# Patient Record
Sex: Female | Born: 1995
Health system: Southern US, Community
[De-identification: ages and names within clinical notes are randomized; demographics above are authoritative.]

## PROBLEM LIST (undated history)

## (undated) DIAGNOSIS — D821 Di George's syndrome: Secondary | ICD-10-CM

## (undated) DIAGNOSIS — R002 Palpitations: Secondary | ICD-10-CM

## (undated) DIAGNOSIS — I341 Nonrheumatic mitral (valve) prolapse: Secondary | ICD-10-CM

## (undated) DIAGNOSIS — R9431 Abnormal electrocardiogram [ECG] [EKG]: Secondary | ICD-10-CM

## (undated) DIAGNOSIS — F319 Bipolar disorder, unspecified: Secondary | ICD-10-CM

## (undated) DIAGNOSIS — F419 Anxiety disorder, unspecified: Secondary | ICD-10-CM

## (undated) DIAGNOSIS — R6250 Unspecified lack of expected normal physiological development in childhood: Secondary | ICD-10-CM

## (undated) DIAGNOSIS — F4310 Post-traumatic stress disorder, unspecified: Secondary | ICD-10-CM

## (undated) HISTORY — DX: Nonrheumatic mitral (valve) prolapse: I34.1

## (undated) HISTORY — DX: Post-traumatic stress disorder, unspecified: F43.10

## (undated) HISTORY — DX: Palpitations: R00.2

## (undated) HISTORY — DX: Anxiety disorder, unspecified: F41.9

## (undated) HISTORY — DX: Abnormal electrocardiogram (ECG) (EKG): R94.31

## (undated) HISTORY — DX: Unspecified lack of expected normal physiological development in childhood: R62.50

## (undated) HISTORY — DX: Bipolar disorder, unspecified: F31.9

## (undated) HISTORY — DX: Di George's syndrome: D82.1

---
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
## 2015-01-13 ENCOUNTER — Encounter: Payer: Self-pay | Admitting: Obstetrics and Gynecology

## 2015-01-15 IMAGING — US THYROID ULTRASOUND
1 series · 14 of 22 positions shown · non-contrast
Comparison: None.

CLINICAL DATA: Mass of Right Neck

EXAM:
ULTRASOUND OF HEAD/NECK SOFT TISSUES
TECHNIQUE: Ultrasound examination of the head and neck soft tissues was
performed in the area of clinical concern.

[Series 1: thyroid ultrasound · 0.08mm/px · 14 of 22 slices shown]
[im 1/22]
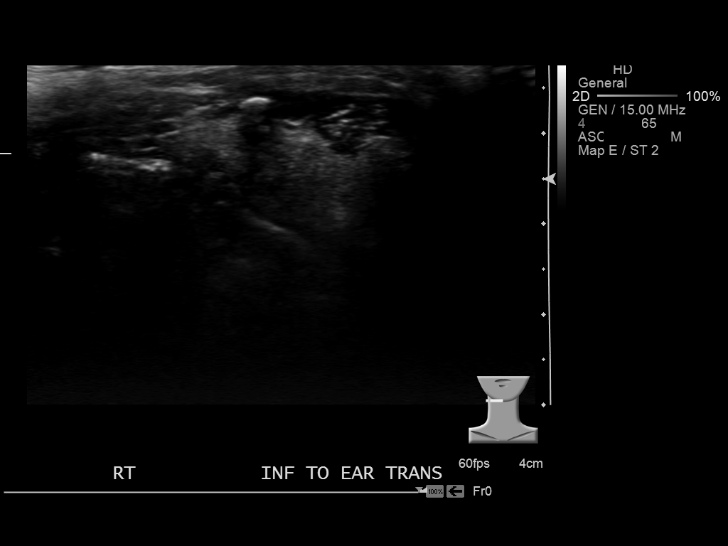
[im 3/22]
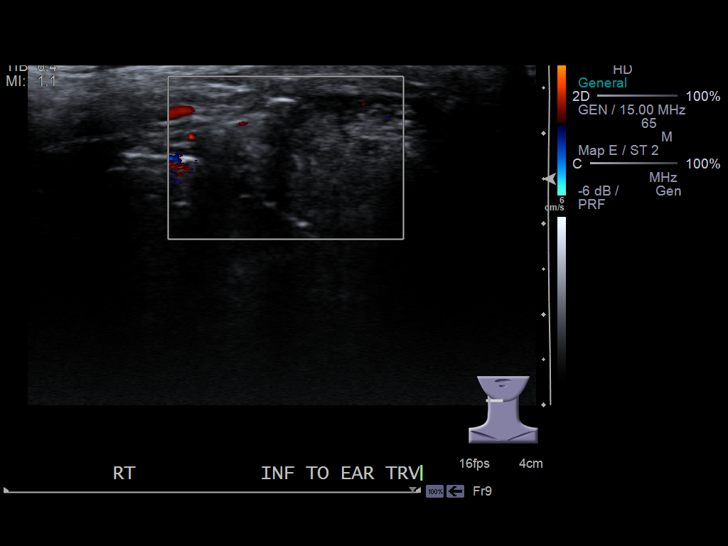
[im 4/22]
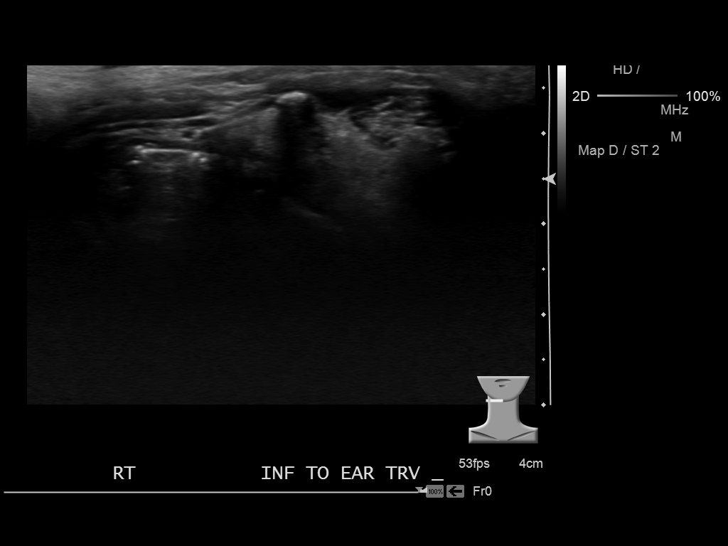
[im 6/22]
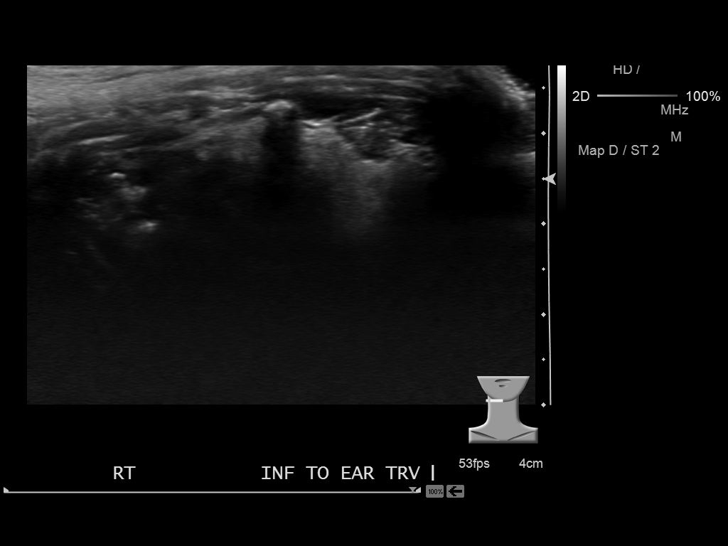
[im 8/22]
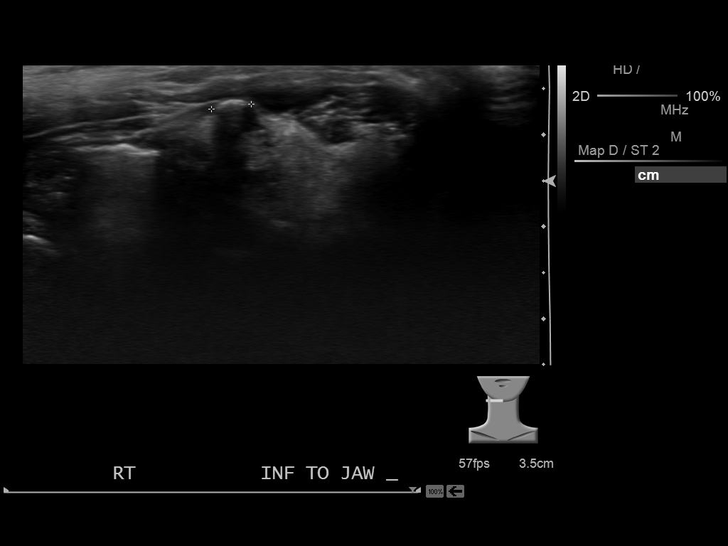
[im 9/22]
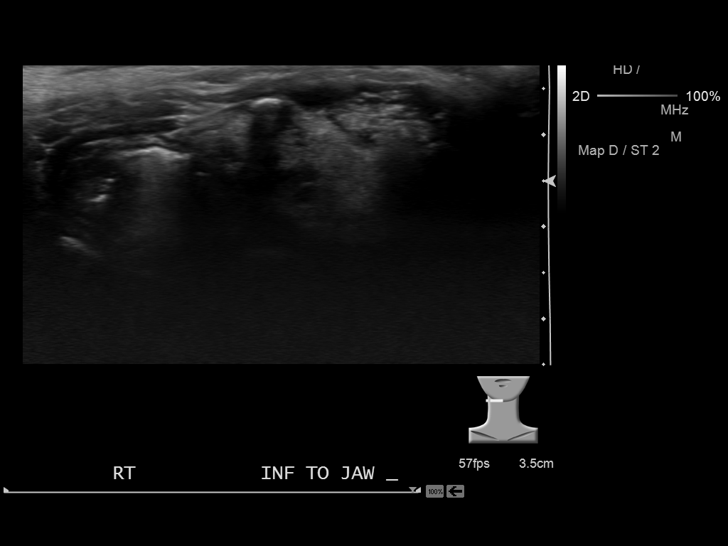
[im 11/22]
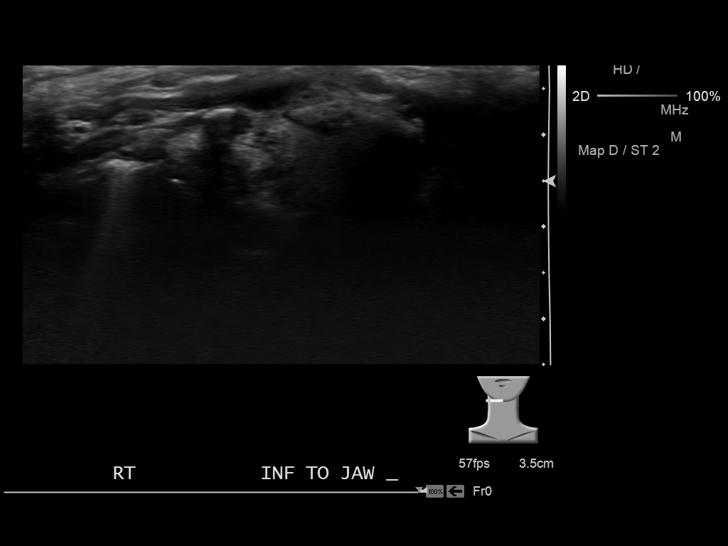
[im 12/22]
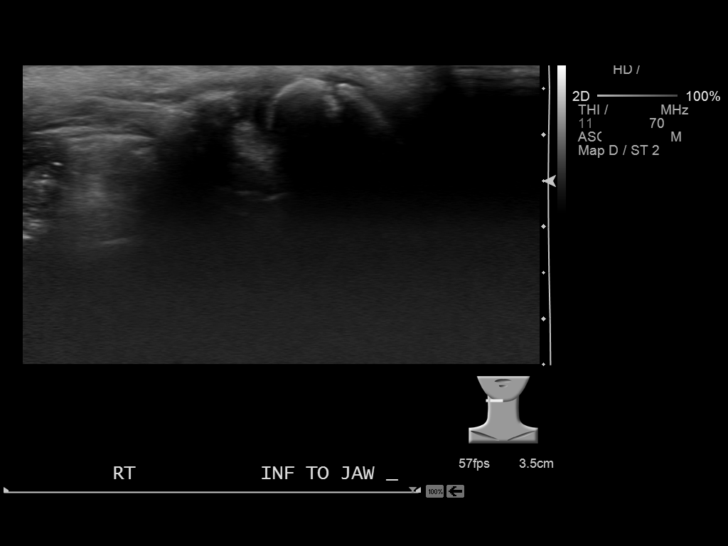
[im 14/22]
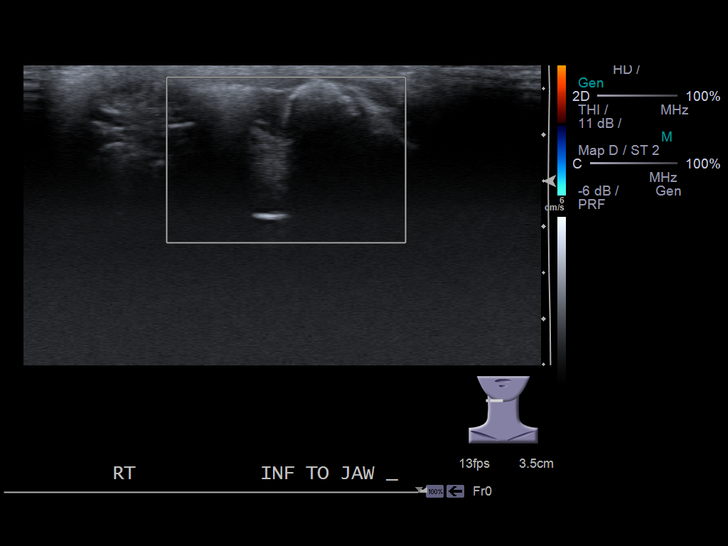
[im 15/22]
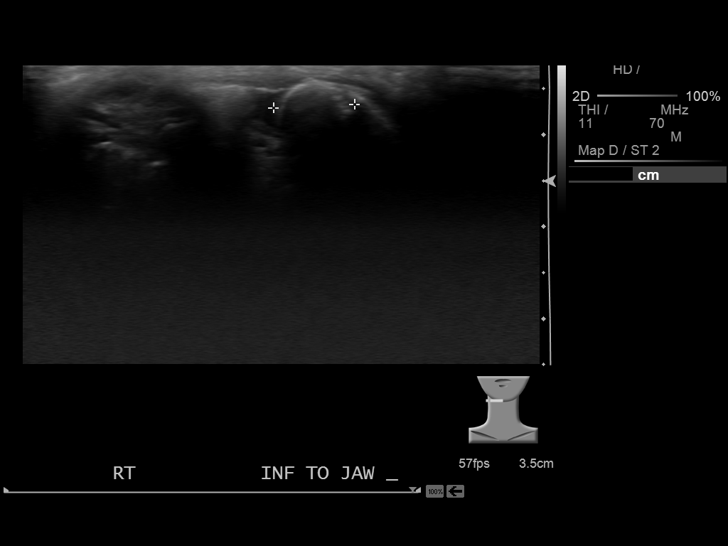
[im 17/22]
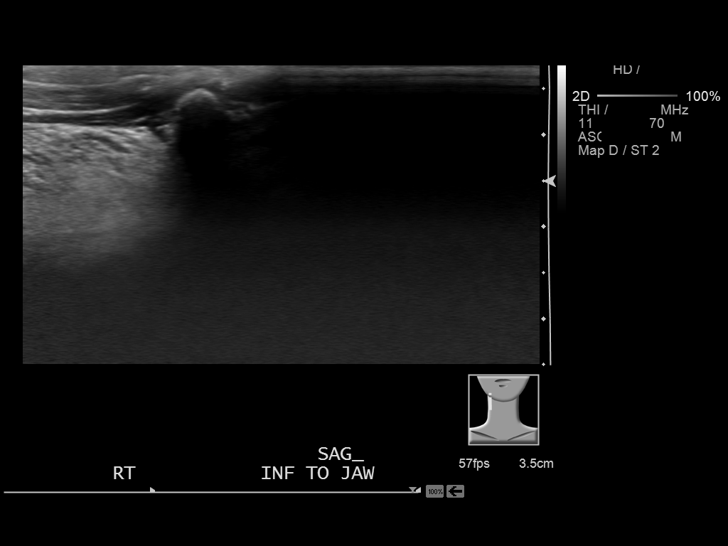
[im 19/22]
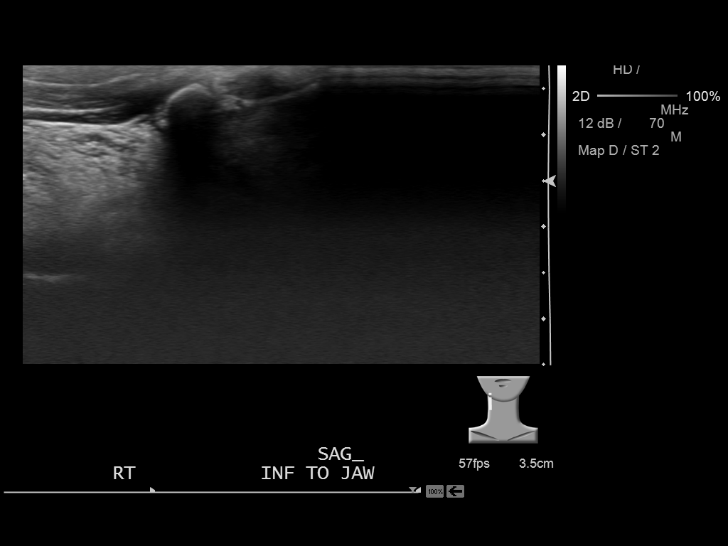
[im 20/22]
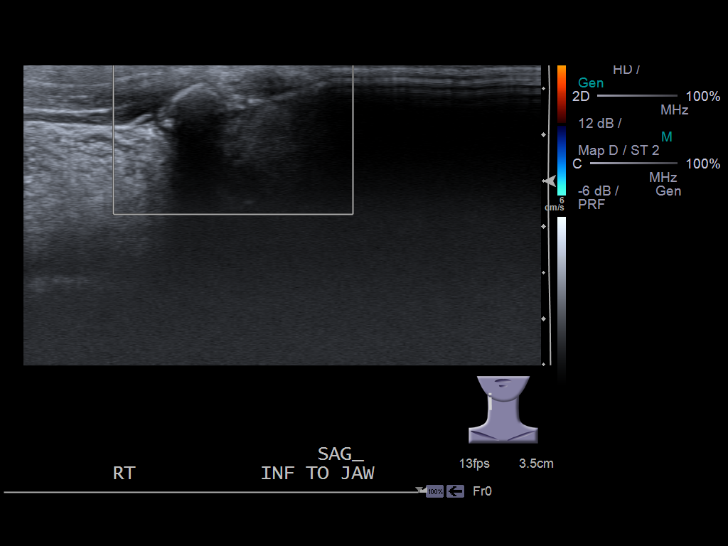
[im 22/22]
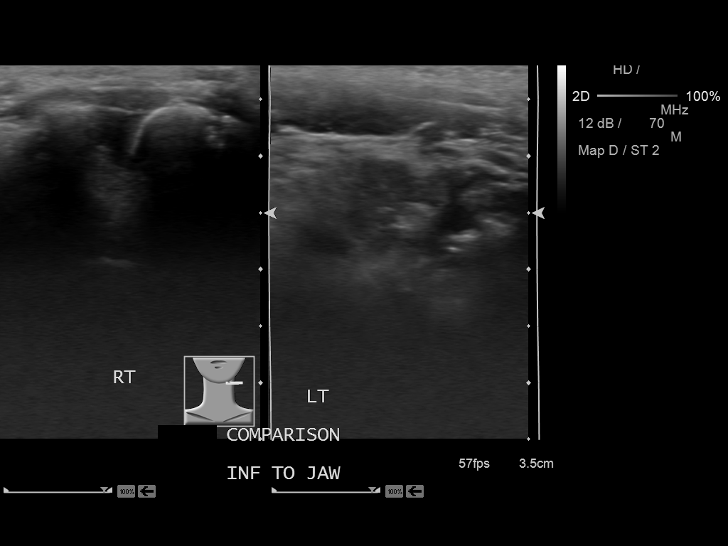

[14 of 22 positions shown; findings below may reference images not displayed]

FINDINGS: There is a cluster of shadowing calcifications in the right
infra-auricular/ submandibular deep subcutaneous tissues measured up
to 9 mm.
IMPRESSION: Lesion with clustered coarse calcifications in region of palpable
abnormality. CT neck with contrast suggested for further
characterization.

## 2015-02-06 ENCOUNTER — Encounter: Payer: Self-pay | Admitting: Obstetrics and Gynecology

## 2015-02-25 ENCOUNTER — Encounter: Payer: Self-pay | Admitting: Obstetrics and Gynecology

## 2015-02-25 ENCOUNTER — Ambulatory Visit (INDEPENDENT_AMBULATORY_CARE_PROVIDER_SITE_OTHER): Payer: BLUE CROSS/BLUE SHIELD | Admitting: Obstetrics and Gynecology

## 2015-02-25 VITALS — BP 84/53 | HR 88 | Ht 67.0 in | Wt 103.1 lb

## 2015-02-25 DIAGNOSIS — A499 Bacterial infection, unspecified: Secondary | ICD-10-CM | POA: Diagnosis not present

## 2015-02-25 DIAGNOSIS — N898 Other specified noninflammatory disorders of vagina: Secondary | ICD-10-CM

## 2015-02-25 DIAGNOSIS — L280 Lichen simplex chronicus: Secondary | ICD-10-CM

## 2015-02-25 DIAGNOSIS — B9689 Other specified bacterial agents as the cause of diseases classified elsewhere: Secondary | ICD-10-CM

## 2015-02-25 DIAGNOSIS — N760 Acute vaginitis: Secondary | ICD-10-CM

## 2015-02-25 DIAGNOSIS — N904 Leukoplakia of vulva: Secondary | ICD-10-CM | POA: Insufficient documentation

## 2015-02-25 LAB — POCT URINALYSIS DIPSTICK
Bilirubin, UA: NEGATIVE
Glucose, UA: NEGATIVE
Ketones, UA: NEGATIVE
Leukocytes, UA: NEGATIVE
Nitrite, UA: NEGATIVE
PROTEIN UA: NEGATIVE
UROBILINOGEN UA: 0.2
pH, UA: 6

## 2015-02-25 MED ORDER — CLOBETASOL PROPIONATE 0.05 % EX OINT: TOPICAL_OINTMENT | CUTANEOUS | Status: DC

## 2015-02-25 NOTE — Progress Notes (Signed)
Subjective:     Patient ID: Sheila Camacho, female   DOB: Sep 19, 1995, 19 y.o.   MRN: [id number]  HPI Referred for vaginal irritation for approximately 1 year.  Review of Systems Clitoral irritation and dysuria on/off x 1 year, initially treated for yeast infection and symptoms didn't improve, now reports daily burning sensations at vaginal entrance and burning at times when urine contacts skin; denies changes with menses (uses pads and tampons) and has normal monthly menses. Has never been sexually active. Denies known trauma to the area. Great-grandmother is here as patient is slightly developmental delayed secondary to premature birth.    Objective:   Physical Exam A&O x4 well groomed then caucasion female in no distress Pelvic exam: normal external genitalia, vulva, vagina, cervix, uterus and adnexa, VULVA: normal appearing vulva with no masses, tenderness or lesions, vulvar erythema at clitoral hood c/w lichens, vulvar hypopigmentation also at clitoral hood, WET MOUNT done - results: KOH done, clue cells, exam chaperoned by CMA and Great-grandmother.    Assessment:     Lichens of vulvar and clitoral hood BV     Plan:     Clobetasol cream nightly x 4 weeks, then qohs x 2 weeks then prn. Flagyl po x 3 days prn   RTC 6 weeks  [place]an [place] Ines Bloomer, [place]

## 2015-02-25 NOTE — Patient Instructions (Addendum)
Thank you for enrolling in MyChart. Please follow the instructions below to securely access your online medical record. MyChart allows you to send messages to your doctor, view your test results, renew your prescriptions, schedule appointments, and more.  How Do I Sign Up? 1. In your Internet browser, go to [email or web address] 2. Click on the New  User? link in the Sign In box.  3. Enter your MyChart Access Code exactly as it appears below. You will not need to use this code after you have completed the sign-up process. If you do not sign up before the expiration date, you must request a new code. MyChart Access Code: [email or web address] Expires: 02/28/2015 10:19 AM  4. Enter the last four digits of your Social Security Number (xxxx) and Date of Birth (mm/dd/yyyy) as indicated and click Next. You will be taken to the next sign-up page. 5. Create a MyChart ID. This will be your MyChart login ID and cannot be changed, so think of one that is secure and easy to remember. 6. Create a MyChart password. You can change your password at any time. 7. Enter your Password Reset Question and Answer and click Next. This can be used at a later time if you forget your password.  8. Select your communication preference, and if applicable enter your e-mail address. You will receive e-mail notification when new information is available in MyChart by choosing to receive e-mail notifications and filling in your e-mail. 9. Click Sign In. You can now view your medical record.   Additional Information If you have questions, you can email [email or web address] or call [phone] to talk to our MyChart staff. Remember, MyChart is NOT to be used for urgent needs. For medical emergencies, dial 911. Lichen Sclerosus Lichen sclerosus is a skin problem. It can happen on any part of the body. It happens most often in the anal or genital areas. Girls and women get lichen sclerosus more often than  boys and men. The cause is not known. This skin problem is not passed from one person to another. HOME CARE  Only take medicines as told by your doctor.  Keep the vagina as clean and dry as you can. GET HELP RIGHT AWAY IF: Your pain, puffiness (swelling), or redness gets worse. MAKE SURE YOU:  Understand these instructions.  Will watch your condition.  Will get help right away if you are not doing well or get worse. Document Released: 06/16/2008 Document Revised: 09/26/2011 Document Reviewed: 11/05/2010 [facility] Patient Information 2015 [place], [place]. This information is not intended to replace advice given to you by your health care provider. Make sure you discuss any questions you have with your health care provider. Bacterial Vaginosis Bacterial vaginosis is a vaginal infection that occurs when the normal balance of bacteria in the vagina is disrupted. It results from an overgrowth of certain bacteria. This is the most common vaginal infection in women of childbearing age. Treatment is important to prevent complications, especially in pregnant women, as it can cause a premature delivery. CAUSES  Bacterial vaginosis is caused by an increase in harmful bacteria that are normally present in smaller amounts in the vagina. Several different kinds of bacteria can cause bacterial vaginosis. However, the reason that the condition develops is not fully understood. RISK FACTORS Certain activities or behaviors can put you at an increased risk of developing bacterial vaginosis, including:  Having a new sex partner or multiple sex partners.  Douching.  Using an intrauterine device (IUD) for  contraception. Women do not get bacterial vaginosis from toilet seats, bedding, swimming pools, or contact with objects around them. SIGNS AND SYMPTOMS  Some women with bacterial vaginosis have no signs or symptoms. Common symptoms include:  Grey vaginal discharge.  A fishlike odor with discharge,  especially after sexual intercourse.  Itching or burning of the vagina and vulva.  Burning or pain with urination. DIAGNOSIS  Your health care provider will take a medical history and examine the vagina for signs of bacterial vaginosis. A sample of vaginal fluid may be taken. Your health care provider will look at this sample under a microscope to check for bacteria and abnormal cells. A vaginal pH test may also be done.  TREATMENT  Bacterial vaginosis may be treated with antibiotic medicines. These may be given in the form of a pill or a vaginal cream. A second round of antibiotics may be prescribed if the condition comes back after treatment.  HOME CARE INSTRUCTIONS   Only take over-the-counter or prescription medicines as directed by your health care provider.  If antibiotic medicine was prescribed, take it as directed. Make sure you finish it even if you start to feel better.  Do not have sex until treatment is completed.  Tell all sexual partners that you have a vaginal infection. They should see their health care provider and be treated if they have problems, such as a mild rash or itching.  Practice safe sex by using condoms and only having one sex partner. SEEK MEDICAL CARE IF:   Your symptoms are not improving after 3 days of treatment.  You have increased discharge or pain.  You have a fever. MAKE SURE YOU:   Understand these instructions.  Will watch your condition.  Will get help right away if you are not doing well or get worse. FOR MORE INFORMATION  Centers for Disease Control and Prevention, Division of STD Prevention: SolutionApps.co.za American Sexual Health Association (ASHA): www.ashastd.org  Document Released: 07/04/2005 Document Revised: 04/24/2013 Document Reviewed: 02/13/2013 [facility] Patient Information 2015 [place], [place]. This information is not intended to replace advice given to you by your health care provider. Make sure you discuss any questions you  have with your health care provider.

## 2015-03-12 ENCOUNTER — Other Ambulatory Visit: Payer: Self-pay | Admitting: *Deleted

## 2015-03-12 MED ORDER — KETOCONAZOLE 2 % EX SHAM: MEDICATED_SHAMPOO | CUTANEOUS | Status: DC

## 2015-03-30 ENCOUNTER — Encounter: Payer: Self-pay | Admitting: Family Medicine

## 2015-03-30 ENCOUNTER — Encounter: Payer: Self-pay | Admitting: Emergency Medicine

## 2015-03-30 ENCOUNTER — Emergency Department: Payer: BLUE CROSS/BLUE SHIELD

## 2015-03-30 ENCOUNTER — Ambulatory Visit (INDEPENDENT_AMBULATORY_CARE_PROVIDER_SITE_OTHER): Payer: BLUE CROSS/BLUE SHIELD | Admitting: Family Medicine

## 2015-03-30 ENCOUNTER — Emergency Department
Admission: EM | Admit: 2015-03-30 | Discharge: 2015-03-30 | Disposition: A | Discharge status: Home / Self Care | Payer: BLUE CROSS/BLUE SHIELD | Attending: Emergency Medicine | Admitting: Emergency Medicine

## 2015-03-30 VITALS — BP 96/68 | HR 106 | Temp 99.3°F | Ht 67.0 in | Wt 96.0 lb

## 2015-03-30 DIAGNOSIS — Z3202 Encounter for pregnancy test, result negative: Secondary | ICD-10-CM | POA: Insufficient documentation

## 2015-03-30 DIAGNOSIS — J159 Unspecified bacterial pneumonia: Secondary | ICD-10-CM | POA: Diagnosis not present

## 2015-03-30 DIAGNOSIS — R05 Cough: Secondary | ICD-10-CM

## 2015-03-30 DIAGNOSIS — R6250 Unspecified lack of expected normal physiological development in childhood: Secondary | ICD-10-CM | POA: Diagnosis not present

## 2015-03-30 DIAGNOSIS — Q939 Deletion from autosomes, unspecified: Secondary | ICD-10-CM | POA: Diagnosis not present

## 2015-03-30 DIAGNOSIS — R059 Cough, unspecified: Secondary | ICD-10-CM

## 2015-03-30 DIAGNOSIS — J189 Pneumonia, unspecified organism: Secondary | ICD-10-CM

## 2015-03-30 LAB — COMPREHENSIVE METABOLIC PANEL
ALK PHOS: 75 U/L (ref 38–126)
ALT: 10 U/L — AB (ref 14–54)
AST: 19 U/L (ref 15–41)
Albumin: 3.9 g/dL (ref 3.5–5.0)
Anion gap: 10 (ref 5–15)
BILIRUBIN TOTAL: 0.8 mg/dL (ref 0.3–1.2)
BUN: 6 mg/dL (ref 6–20)
CALCIUM: 8.7 mg/dL — AB (ref 8.9–10.3)
CO2: 28 mmol/L (ref 22–32)
CREATININE: 0.64 mg/dL (ref 0.44–1.00)
Chloride: 98 mmol/L — ABNORMAL LOW (ref 101–111)
Glucose, Bld: 96 mg/dL (ref 65–99)
Potassium: 3 mmol/L — ABNORMAL LOW (ref 3.5–5.1)
Sodium: 136 mmol/L (ref 135–145)
TOTAL PROTEIN: 7.7 g/dL (ref 6.5–8.1)

## 2015-03-30 LAB — URINALYSIS COMPLETE WITH MICROSCOPIC (ARMC ONLY)
BILIRUBIN URINE: NEGATIVE
GLUCOSE, UA: NEGATIVE mg/dL
Nitrite: NEGATIVE
PH: 6 (ref 5.0–8.0)
Protein, ur: 30 mg/dL — AB
Specific Gravity, Urine: 1.016 (ref 1.005–1.030)

## 2015-03-30 LAB — CBC
HCT: 36.8 % (ref 35.0–47.0)
Hemoglobin: 12.8 g/dL (ref 12.0–16.0)
MCH: 30.9 pg (ref 26.0–34.0)
MCHC: 34.6 g/dL (ref 32.0–36.0)
MCV: 89.3 fL (ref 80.0–100.0)
PLATELETS: 231 10*3/uL (ref 150–440)
RBC: 4.12 MIL/uL (ref 3.80–5.20)
RDW: 12.1 % (ref 11.5–14.5)
WBC: 11.2 10*3/uL — AB (ref 3.6–11.0)

## 2015-03-30 LAB — PREGNANCY, URINE: Preg Test, Ur: NEGATIVE

## 2015-03-30 LAB — LIPASE, BLOOD: LIPASE: 16 U/L — AB (ref 22–51)

## 2015-03-30 MED ORDER — ALBUTEROL SULFATE HFA 108 (90 BASE) MCG/ACT IN AERS: 2.0000 | INHALATION_SPRAY | Freq: Four times a day (QID) | RESPIRATORY_TRACT | Status: DC | PRN

## 2015-03-30 MED ORDER — DOXYCYCLINE HYCLATE 100 MG PO TABS: 100.0000 mg | ORAL_TABLET | Freq: Two times a day (BID) | ORAL | Status: DC

## 2015-03-30 MED ORDER — DOXYCYCLINE HYCLATE 100 MG PO TABS
100.0000 mg | ORAL_TABLET | Freq: Two times a day (BID) | ORAL | Status: DC
  Administered 2015-03-30: 100 mg via ORAL
  Filled 2015-03-30 (×3): qty 1

## 2015-03-30 NOTE — ED Provider Notes (Signed)
[facility] Emergency Department Provider Note  ____________________________________________  Time seen: Approximately 840 PM  I have reviewed the triage vital signs and the nursing notes.   HISTORY  Chief Complaint Cough    HPI Yuriana Gaal Flamenco is a 19 y.o. female with a history of a chromosomal deletion who is presenting today with 4 days of increasing cough. She is having throat pain with her cough and also rhinorrhea. Denies any fevers. Was seen at her primary care doctor's today and was walked up and down the hall with her and her oxygen saturation went into the 80s. She denies any shortness of breath with walking. Mom says that she also has a history of a prolonged QT syndrome. No recent hospitalizations. Had pneumonia once as well as a child but none recently. No known sick contacts.   Past Medical History  Diagnosis Date  . 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 Cough 03/30/2015  . BV (bacterial vaginosis) 02/25/2015  . Lichen 02/25/2015  . Vaginitis and vulvovaginitis 07/31/2014  . Thrombocytopenia 04/01/2012  . Dizziness 03/30/2012  . LONG QT SYNDROME 06/02/2009  . ADHD 03/06/2008  . DEVELOPMENTAL DELAY 03/06/2008  . BUNIONS, BILATERAL 03/06/2008  . CARDIAC MURMUR, HX OF 03/06/2008    Past Surgical History  Procedure Laterality Date  . Hernia repair  2005    Current Outpatient Rx  Name  Route  Sig  Dispense  Refill  . albuterol (PROVENTIL HFA;VENTOLIN HFA) 108 (90 BASE) MCG/ACT inhaler   Inhalation   Inhale 2 puffs into the lungs every 6 (six) hours as needed for wheezing or shortness of breath. Patient not taking: Reported on 03/30/2015   1 Inhaler   3   . clobetasol ointment (TEMOVATE) 0.05 %      Apply to affected area every night for 4 weeks, then every other day for 4 weeks and then  twice a week for 4 weeks or until resolution. Patient not taking: Reported on 03/30/2015   30 g   5   . ketoconazole (NIZORAL) 2 % shampoo      WASH HAIR WITH SHAMPOO DAILY Patient not taking: Reported on 03/30/2015   240 mL   0   . Multiple Vitamin (MULTIVITAMIN) tablet      Take one by mouth 1-2 times a week           Allergies Review of patient's allergies indicates no known allergies.  Family History  Problem Relation Age of Onset  . Mitral valve prolapse Mother   . Asthma Maternal Grandmother   . Cancer Neg Hx     Social History Social History  Substance Use Topics  . Smoking status: Never Smoker   . Smokeless tobacco: Never Used  . Alcohol Use: No    Review of Systems Constitutional: No fever/chills Eyes: No visual changes. ENT: No sore throat. Cardiovascular: Denies chest pain. Respiratory: Positive for cough Gastrointestinal: No abdominal pain.  No nausea, no vomiting.  No diarrhea.  No constipation. Genitourinary: Negative for dysuria. Musculoskeletal: Negative for back pain. Skin: Negative for rash. Neurological: Negative for headaches, focal weakness or numbness.  10-point ROS otherwise negative.  ____________________________________________   PHYSICAL EXAM:  VITAL SIGNS: ED Triage Vitals  Enc Vitals Group     BP 03/30/15 1646 108/72 mmHg     Pulse Rate 03/30/15 1646 120  Resp 03/30/15 1646 18     Temp 03/30/15 1646 99.7 F (37.6 C)     Temp Source 03/30/15 1646 Oral     SpO2 03/30/15 1646 99 %     Weight 03/30/15 1646 96 lb (43.545 kg)     Height 03/30/15 1646 5\' 6"  (1.676 m)     Head Cir --      Peak Flow --      Pain Score 03/30/15 1656 3     Pain Loc --      Pain Edu? --      Excl. in GC? --     Constitutional: Alert and oriented. Well appearing and in no acute distress. Eyes: Conjunctivae are normal. PERRL. EOMI. Head: Atraumatic. Nose: Nasal congestion with yellow mucus to the bilateral nares. Mouth/Throat: Mucous  membranes are moist.  Oropharynx non-erythematous. Neck: No stridor.   Cardiovascular: Normal rate, regular rhythm. Heart rate of 98 when I'm in the room with her at the bedside. Grossly normal heart sounds.  Good peripheral circulation. Respiratory: Normal respiratory effort.  No retractions. Vesicular lung sounds to the left lower field. Gastrointestinal: Soft and nontender. No distention. No abdominal bruits. No CVA tenderness. Musculoskeletal: No lower extremity tenderness nor edema.  No joint effusions. Neurologic:  Normal speech and language. No gross focal neurologic deficits are appreciated. No gait instability. Skin:  Skin is warm, dry and intact. No rash noted. Psychiatric: Mood and affect are normal. Speech and behavior are normal.  ____________________________________________   LABS (all labs ordered are listed, but only abnormal results are displayed)  Labs Reviewed  LIPASE, BLOOD - Abnormal; Notable for the following:    Lipase 16 (*)    All other components within normal limits  COMPREHENSIVE METABOLIC PANEL - Abnormal; Notable for the following:    Potassium 3.0 (*)    Chloride 98 (*)    Calcium 8.7 (*)    ALT 10 (*)    All other components within normal limits  CBC - Abnormal; Notable for the following:    WBC 11.2 (*)    All other components within normal limits  URINALYSIS COMPLETEWITH MICROSCOPIC ([facility] ONLY) - Abnormal; Notable for the following:    Color, Urine YELLOW (*)    APPearance HAZY (*)    Ketones, ur 1+ (*)    Hgb urine dipstick 1+ (*)    Protein, ur 30 (*)    Leukocytes, UA 2+ (*)    Bacteria, UA RARE (*)    Squamous Epithelial / LPF 0-5 (*)    All other components within normal limits  PREGNANCY, URINE   ____________________________________________  EKG   ____________________________________________  RADIOLOGY  Left lower lobe bronchopneumonia. I personally reviewed these  films. ____________________________________________   PROCEDURES   ____________________________________________   INITIAL IMPRESSION / ASSESSMENT AND PLAN / ED COURSE  Pertinent labs & imaging results that were available during my care of the patient were reviewed by me and considered in my medical decision making (see chart for details).  Patient walked in the room and did not desat below 98%. Also denied any shortness of breath with ambulation. Did have a mild increase in her heart rate to 110. We'll discharge with antibiotic treatment as well as an inhaler for her cough. Discussed with the family that the patient's major complaint fluids to avoid dehydration. The patient and the family understand the plan and are willing to comply. ____________________________________________   FINAL CLINICAL IMPRESSION(S) / ED DIAGNOSES  Acute bronchopneumonia. Initial visit.  Myrna Blazer, MD 03/30/15 [phone]

## 2015-03-30 NOTE — ED Notes (Signed)
Sent in from pmd's office with cough .per orders family she has been coughing up blood and her o2 sat were low at the office

## 2015-03-30 NOTE — Discharge Instructions (Signed)

## 2015-03-30 NOTE — ED Notes (Signed)
Mother to desk upset over wait time; pt sitting in lobby with no distress noted; will recheck vs; mother updated on wait time

## 2015-03-30 NOTE — Assessment & Plan Note (Signed)
Patient with 6 days of cough and sinus congestion now with shortness of breath and cough productive of pink material. She appears ill and dehydrated at this time. Tachycardic to the 120's in th office. O2 desat to 89% on ambulation. I feel that the patient needs evaluation in the ED for this issue given given her tachycardia and desat. I discussed this with the patient, her grandmother, and her mother (over the phone) and advised EMS transport to the ED for further management and evaluation. CMA called ED charge nurse to inform that patient was on her way to the ED.

## 2015-03-30 NOTE — Progress Notes (Signed)
Pre visit review using our clinic review tool, if applicable. No additional management support is needed unless otherwise documented below in the visit note. 

## 2015-03-30 NOTE — Progress Notes (Signed)
Patient ID: Sheila Camacho, female   DOB: 03-11-1996, 19 y.o.   MRN: [id number]  Marikay Alar, MD Phone: [phone]  Sheila Camacho is a 19 y.o. female who presents today for same day appointment.   Patient presents with cough and sinus congestion for 6 days. Notes this started with sore throat and mild nasal congestion. Has progressed to shortness of breath and productive cough of pink material. She states she feels poorly. Has been using mucinex and delsym. No fevers, though temp to 100 F at home. Ears hurt. No sick contacts. Has been drinking ok, though reports her mouth is dry and lips are cracked. Has had some post tussive emesis. Notes some sharp central chest discomfort with coughing. Chills as well.  PMH: nonsmoker.    ROS see HPI  Objective  Physical Exam Filed Vitals:   03/30/15 1454  BP: 98/64  Pulse: 123  Temp: 99.3 F (37.4 C)  Ambulatory O2 sat 89%  Physical Exam  Constitutional:  Ill appearing  HENT:  Head: Normocephalic and atraumatic.  Right Ear: External ear normal.  Left Ear: External ear normal.  Mouth/Throat: No oropharyngeal exudate.  Mild posterior OP erythema  Eyes: Conjunctivae are normal. Pupils are equal, round, and reactive to light.  Neck: Neck supple.  Cardiovascular: Exam reveals no gallop and no friction rub.   No murmur heard. Tachycardic   Pulmonary/Chest: Effort normal and breath sounds normal. No respiratory distress. She has no wheezes. She has no rales.  Lymphadenopathy:    She has no cervical adenopathy.  Neurological: She is alert.  Skin: Skin is warm and dry. She is not diaphoretic.     Assessment/Plan: Please see individual problem list.  Cough Patient with 6 days of cough and sinus congestion now with shortness of breath and cough productive of pink material. She appears ill and dehydrated at this time. Tachycardic to the 120's in th office. O2 desat to 89% on ambulation. I feel that the patient needs  evaluation in the ED for this issue given given her tachycardia and desat. I discussed this with the patient, her grandmother, and her mother (over the phone) and advised EMS transport to the ED for further management and evaluation. CMA called ED charge nurse to inform that patient was on her way to the ED.  Just prior to EMS arriving the first responders were her and the patient reported that she coughed up some blood.   Marikay Alar

## 2015-04-08 ENCOUNTER — Ambulatory Visit: Payer: BLUE CROSS/BLUE SHIELD | Admitting: Obstetrics and Gynecology

## 2015-08-24 ENCOUNTER — Other Ambulatory Visit: Payer: Self-pay | Admitting: Family Medicine

## 2015-08-24 NOTE — Telephone Encounter (Signed)
Last office visit 03/30/2015 with Dr. Birdie Sons in [place].  Last refilled 03/12/2015 for 240 ml with no refills.  Ok to refill?

## 2015-09-24 ENCOUNTER — Encounter: Payer: Self-pay | Admitting: Family Medicine

## 2015-09-24 ENCOUNTER — Ambulatory Visit (INDEPENDENT_AMBULATORY_CARE_PROVIDER_SITE_OTHER): Payer: BLUE CROSS/BLUE SHIELD | Admitting: Family Medicine

## 2015-09-24 VITALS — BP 100/60 | HR 90 | Temp 98.4°F | Ht 67.0 in | Wt 102.8 lb

## 2015-09-24 DIAGNOSIS — N760 Acute vaginitis: Secondary | ICD-10-CM

## 2015-09-24 DIAGNOSIS — N904 Leukoplakia of vulva: Secondary | ICD-10-CM | POA: Diagnosis not present

## 2015-09-24 MED ORDER — CLOBETASOL PROPIONATE 0.05 % EX OINT: TOPICAL_OINTMENT | CUTANEOUS | Status: DC

## 2015-09-24 NOTE — Assessment & Plan Note (Signed)
Send wet prep to lab to rule out recurrent bacterial of yeast infection.

## 2015-09-24 NOTE — Assessment & Plan Note (Addendum)
Recommend clobetasol x 6-12 weeks then use for maintenance 2-3 times weekly. If not improving follow up with GYN.

## 2015-09-24 NOTE — Progress Notes (Signed)
Subjective:    Patient ID: Sheila Camacho, female    DOB: 02/29/1996, 20 y.o.   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   She was referred to GYN last year for chronic vaginal irritation:  Clitoral irritation and dysuria on/off x 1 year, initially treated for yeast infection and symptoms didn't improve, now reports daily burning sensations at vaginal entrance and burning at times when urine contacts skin; denies changes with menses (uses pads and tampons) and has normal monthly menses. Has never been sexually active. Denies known trauma to the area. Treated 02/2015 per GYN with flagyl for BV and Clobetasol for lichen sclerosis of vulvar area.   Today she reports she continues to have vaginal discharge, vaginal irritation and vaginal odor.   She reports that she had improvement initially after the clobetasol x 6 weeks but when stopped it came back. In last few weeks vaginal odor fishy has returned.   She never followed up with GYN as instructed.  Social History /Family History/Past Medical History reviewed and updated if needed.   Review of Systems  Constitutional: Negative for fever and fatigue.  HENT: Negative for ear pain.   Eyes: Negative for pain.  Respiratory: Negative for chest tightness and shortness of breath.   Cardiovascular: Negative for chest pain, palpitations and leg swelling.  Gastrointestinal: Negative for abdominal pain.  Genitourinary: Negative for dysuria.       Objective:   Physical Exam  Constitutional: Vital signs are normal. She appears well-developed and well-nourished. She is cooperative.  Non-toxic appearance. She does not appear ill. No distress.  HENT:  Head: Normocephalic.  Right Ear: Hearing, tympanic membrane, external ear and ear canal normal. Tympanic membrane is not erythematous, not retracted and not bulging.  Left Ear: Hearing, tympanic membrane, external  ear and ear canal normal. Tympanic membrane is not erythematous, not retracted and not bulging.  Nose: No mucosal edema or rhinorrhea. Right sinus exhibits no maxillary sinus tenderness and no frontal sinus tenderness. Left sinus exhibits no maxillary sinus tenderness and no frontal sinus tenderness.  Mouth/Throat: Uvula is midline, oropharynx is clear and moist and mucous membranes are normal.  Eyes: Conjunctivae, EOM and lids are normal. Pupils are equal, round, and reactive to light. Lids are everted and swept, no foreign bodies found.  Neck: Trachea normal and normal range of motion. Neck supple. Carotid bruit is not present. No thyroid mass and no thyromegaly present.  Cardiovascular: Normal rate, regular rhythm, S1 normal, S2 normal, normal heart sounds, intact distal pulses and normal pulses.  Exam reveals no gallop and no friction rub.   No murmur heard. Pulmonary/Chest: Effort normal and breath sounds normal. No tachypnea. No respiratory distress. She has no decreased breath sounds. She has no wheezes. She has no rhonchi. She has no rales.  Abdominal: Soft. Normal appearance and bowel sounds are normal. There is no tenderness.  Genitourinary: There is no rash, tenderness or lesion on the right labia. There is no tenderness or lesion on the left labia. There is tenderness in the vagina. No erythema or bleeding in the vagina. No foreign body around the vagina. No signs of injury around the vagina. Vaginal discharge found.  No obvious pale white lesions  Neurological: She is alert.  Skin: Skin is warm, dry and intact. No rash noted.  Psychiatric: Her speech is normal and behavior is normal. Judgment and thought content normal. Her mood appears not anxious.  Cognition and memory are normal. She does not exhibit a depressed mood.          Assessment & Plan:

## 2015-09-24 NOTE — Patient Instructions (Signed)
We will call with wet prep results form lab.  Restart clobetasol for 6-12 weeks daily then go to 2-3 times weekly for maintenance of lichen sclerosis.  If not improving follow up with GYN as recommended.

## 2015-09-24 NOTE — Progress Notes (Signed)
Pre visit review using our clinic review tool, if applicable. No additional management support is needed unless otherwise documented below in the visit note. 

## 2015-09-25 ENCOUNTER — Telehealth: Payer: Self-pay | Admitting: Family Medicine

## 2015-09-25 LAB — WET PREP BY MOLECULAR PROBE
CANDIDA SPECIES: NEGATIVE
Gardnerella vaginalis: NEGATIVE
Trichomonas vaginosis: NEGATIVE

## 2015-09-25 NOTE — Telephone Encounter (Signed)
Lab results given to patient by telephone.  See Result Note.

## 2015-09-25 NOTE — Telephone Encounter (Signed)
Patient returned [name]'s call. °

## 2016-06-16 ENCOUNTER — Emergency Department
Admission: EM | Admit: 2016-06-16 | Discharge: 2016-06-16 | Disposition: A | Discharge status: Home / Self Care | Payer: BLUE CROSS/BLUE SHIELD | Attending: Emergency Medicine | Admitting: Emergency Medicine

## 2016-06-16 ENCOUNTER — Encounter: Payer: Self-pay | Admitting: Emergency Medicine

## 2016-06-16 DIAGNOSIS — F410 Panic disorder [episodic paroxysmal anxiety] without agoraphobia: Secondary | ICD-10-CM

## 2016-06-16 DIAGNOSIS — R000 Tachycardia, unspecified: Secondary | ICD-10-CM | POA: Diagnosis not present

## 2016-06-16 DIAGNOSIS — R0789 Other chest pain: Secondary | ICD-10-CM | POA: Diagnosis present

## 2016-06-16 DIAGNOSIS — Z5181 Encounter for therapeutic drug level monitoring: Secondary | ICD-10-CM | POA: Diagnosis not present

## 2016-06-16 DIAGNOSIS — F909 Attention-deficit hyperactivity disorder, unspecified type: Secondary | ICD-10-CM | POA: Insufficient documentation

## 2016-06-16 LAB — URINE DRUG SCREEN, QUALITATIVE (ARMC ONLY)
Amphetamines, Ur Screen: NOT DETECTED
Barbiturates, Ur Screen: NOT DETECTED
Benzodiazepine, Ur Scrn: NOT DETECTED
CANNABINOID 50 NG, UR ~~LOC~~: NOT DETECTED
Cocaine Metabolite,Ur ~~LOC~~: NOT DETECTED
MDMA (ECSTASY) UR SCREEN: NOT DETECTED
Methadone Scn, Ur: NOT DETECTED
Opiate, Ur Screen: NOT DETECTED
PHENCYCLIDINE (PCP) UR S: NOT DETECTED
Tricyclic, Ur Screen: NOT DETECTED

## 2016-06-16 MED ORDER — LORAZEPAM 2 MG/ML IJ SOLN
0.5000 mg | Freq: Once | INTRAMUSCULAR | Status: AC
  Administered 2016-06-16: 0.5 mg via INTRAVENOUS

## 2016-06-16 MED ORDER — LORAZEPAM 2 MG/ML IJ SOLN
INTRAMUSCULAR | Status: AC
  Administered 2016-06-16: 0.5 mg via INTRAVENOUS
  Filled 2016-06-16: qty 1
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
## 2016-06-16 NOTE — ED Notes (Signed)
Pt's mom reviewed D/C isntructions with this RN. Pt's mom instructed to bring patient back for any lethargy, N/V, SHOB, chestp pain or any other generally concerning symptoms.
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
## 2016-06-16 NOTE — ED Notes (Addendum)
MD aware of patient's tachycardia. Per MD okay for D/C. Pt denies any chest pain. SHOB, dizziness. Pt is in NAD, able to stand without difficulty, able to ambulate without difficulty. Pt taken to lobby via wheelchair at this time by Delice Bisonara, [employer]P Student. Pt left with her mom at this time.

## 2016-06-16 NOTE — ED Notes (Signed)
[place] to speak to [place].  Patient agrees to talk to them.

## 2016-06-16 NOTE — ED Notes (Signed)
Pt's BP noted to be 90/58, with HR 109. MD aware. Per MD wake patient up and provide stimulation. Pt repeatedly states, "I'm tired I just want to sleep, leave me alone!" This RN continued speaking with patient, pt's BP 97/67. Will continue to provide stimulation to patient at this time. Will continue to monitor for further patient needs.

## 2016-06-16 NOTE — ED Notes (Signed)
Per MD pt is okay for D/C. Pt's mother at bedside at this time.

## 2016-06-16 NOTE — ED Notes (Signed)
[facility] at bedside at this time.
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
## 2016-06-16 NOTE — ED Notes (Signed)
MD updated that patient remains tachycardic at this time. No new orders received.

## 2016-09-02 DIAGNOSIS — F4310 Post-traumatic stress disorder, unspecified: Secondary | ICD-10-CM | POA: Diagnosis not present

## 2016-09-02 DIAGNOSIS — F909 Attention-deficit hyperactivity disorder, unspecified type: Secondary | ICD-10-CM | POA: Diagnosis not present

## 2016-09-08 DIAGNOSIS — F909 Attention-deficit hyperactivity disorder, unspecified type: Secondary | ICD-10-CM | POA: Diagnosis not present

## 2016-09-08 DIAGNOSIS — F4310 Post-traumatic stress disorder, unspecified: Secondary | ICD-10-CM | POA: Diagnosis not present

## 2016-09-29 DIAGNOSIS — F4310 Post-traumatic stress disorder, unspecified: Secondary | ICD-10-CM | POA: Diagnosis not present

## 2016-09-29 DIAGNOSIS — F909 Attention-deficit hyperactivity disorder, unspecified type: Secondary | ICD-10-CM | POA: Diagnosis not present

## 2016-10-04 DIAGNOSIS — F4310 Post-traumatic stress disorder, unspecified: Secondary | ICD-10-CM | POA: Diagnosis not present

## 2016-10-04 DIAGNOSIS — F909 Attention-deficit hyperactivity disorder, unspecified type: Secondary | ICD-10-CM | POA: Diagnosis not present

## 2016-10-18 DIAGNOSIS — F909 Attention-deficit hyperactivity disorder, unspecified type: Secondary | ICD-10-CM | POA: Diagnosis not present

## 2016-10-18 DIAGNOSIS — F4310 Post-traumatic stress disorder, unspecified: Secondary | ICD-10-CM | POA: Diagnosis not present

## 2016-10-27 ENCOUNTER — Telehealth: Payer: Self-pay | Admitting: Family Medicine

## 2016-10-27 ENCOUNTER — Ambulatory Visit: Payer: BLUE CROSS/BLUE SHIELD | Admitting: Primary Care

## 2016-10-27 NOTE — Telephone Encounter (Signed)
Please do not charge fee.

## 2016-10-27 NOTE — Telephone Encounter (Signed)
Pt did not show up for their acute visit.  Do you want to charge No Show Fee? °

## 2016-11-01 ENCOUNTER — Telehealth: Payer: Self-pay | Admitting: Family Medicine

## 2016-11-01 ENCOUNTER — Ambulatory Visit (INDEPENDENT_AMBULATORY_CARE_PROVIDER_SITE_OTHER): Payer: BLUE CROSS/BLUE SHIELD | Admitting: Family Medicine

## 2016-11-01 ENCOUNTER — Encounter: Payer: Self-pay | Admitting: Family Medicine

## 2016-11-01 VITALS — BP 95/61 | HR 78 | Temp 97.5°F | Ht 65.25 in | Wt 98.5 lb

## 2016-11-01 DIAGNOSIS — Z3201 Encounter for pregnancy test, result positive: Secondary | ICD-10-CM

## 2016-11-01 DIAGNOSIS — I4581 Long QT syndrome: Secondary | ICD-10-CM

## 2016-11-01 DIAGNOSIS — O209 Hemorrhage in early pregnancy, unspecified: Secondary | ICD-10-CM

## 2016-11-01 DIAGNOSIS — Z862 Personal history of diseases of the blood and blood-forming organs and certain disorders involving the immune mechanism: Secondary | ICD-10-CM

## 2016-11-01 DIAGNOSIS — Q9359 Other deletions of part of a chromosome: Secondary | ICD-10-CM | POA: Insufficient documentation

## 2016-11-01 DIAGNOSIS — R1030 Lower abdominal pain, unspecified: Secondary | ICD-10-CM | POA: Diagnosis not present

## 2016-11-01 DIAGNOSIS — Z3490 Encounter for supervision of normal pregnancy, unspecified, unspecified trimester: Secondary | ICD-10-CM

## 2016-11-01 DIAGNOSIS — O208 Other hemorrhage in early pregnancy: Secondary | ICD-10-CM

## 2016-11-01 DIAGNOSIS — R636 Underweight: Secondary | ICD-10-CM

## 2016-11-01 DIAGNOSIS — N912 Amenorrhea, unspecified: Secondary | ICD-10-CM

## 2016-11-01 DIAGNOSIS — Q935 Other deletions of part of a chromosome: Secondary | ICD-10-CM

## 2016-11-01 LAB — HCG, QUANTITATIVE, PREGNANCY: Quantitative HCG: 107365 m[IU]/mL

## 2016-11-01 LAB — POCT URINE PREGNANCY: PREG TEST UR: POSITIVE — AB

## 2016-11-01 NOTE — Addendum Note (Signed)
Addended by: Kerby Nora E on: 11/01/2016 04:56 PM   Modules accepted: Orders

## 2016-11-01 NOTE — Patient Instructions (Signed)
Start prenatal vitamin with folic acid as soon as possible.  Avoid alcohol.  Stop at front desk for referral and at lab on way out. Call if referral needed to high risk OB.

## 2016-11-01 NOTE — Assessment & Plan Note (Signed)
Encouraged healthy eating. Denies anorexia and bulemia.

## 2016-11-01 NOTE — Assessment & Plan Note (Signed)
In setting of early pregnancy ( unclear dates given pt poor historian).. concenring for abnormal pregnancy.  Eval with BHCG.. Consider serial eval or TVUS.

## 2016-11-01 NOTE — Progress Notes (Signed)
Pre visit review using our clinic review tool, if applicable. No additional management support is needed unless otherwise documented below in the visit note. 

## 2016-11-01 NOTE — Telephone Encounter (Signed)
Notify pt that her B HCG level suggest she could be 8-[redacted] weeks pregnant. Given abd pain and episode of bleeding.Marland KitchenMarland KitchenI recommend evaluating with transvaginal US.  Make sure she is starting a prenatal vitamin.

## 2016-11-01 NOTE — Progress Notes (Signed)
Subjective:    Patient ID: Sheila Camacho, female    DOB: 04-26-1996, 21 y.o.   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 She is here with her great aunt today, her mom is aware of symptoms. She has been feeling nausea in mornings in last several weeks. She has noted low abdominal soreness, in last few weeks, occ 4-7 /10 on pain scale, not worsening.  Last menses was in early April (not sure day, finished about 1 week ago), was abnormal .. Lighter and shorter, heavy spotting. She is a poor historian for LMP and symptomatology She has breast tenderness.  She is sexually active. Does not use birth control.  This pregnancy was unintended. She is not happy about it but is unsure what she plans to do. She wants to talk about it with her mother further. Has one partner. Likely father does not want to be involved with pregnancy.    Test in office today is positive.  Hx: developmental delay, ADHD Long QT syndrome, no recent cardiology evaluation  She has lost weight recently. Wt Readings from Last 3 Encounters:  11/01/16 98 lb 8 oz (44.7 kg)  06/16/16 100 lb (45.4 kg)  09/24/15 102 lb 12 oz (46.6 kg) (6 %, Z= -1.56)*   * Growth percentiles are based on CDC 2-20 Years data.      Review of Systems  Constitutional: Positive for fatigue. Negative for fever.  HENT: Negative for ear pain.   Eyes: Negative for pain.  Respiratory: Negative for cough and shortness of breath.   Cardiovascular: Negative for chest pain, palpitations and leg swelling.  Gastrointestinal: Positive for abdominal pain and nausea. Negative for blood in stool, constipation and diarrhea.  Genitourinary: Negative for dysuria.       Objective:   Physical Exam  Constitutional: Vital signs are normal. She appears well-developed and well-nourished. She is cooperative.  Non-toxic appearance. She does not appear ill. No  distress.  Thin appearing female in NAD Body mass index is 16.27 kg/m.   HENT:  Head: Normocephalic.  Right Ear: Hearing, tympanic membrane, external ear and ear canal normal. Tympanic membrane is not erythematous, not retracted and not bulging.  Left Ear: Hearing, tympanic membrane, external ear and ear canal normal. Tympanic membrane is not erythematous, not retracted and not bulging.  Nose: No mucosal edema or rhinorrhea. Right sinus exhibits no maxillary sinus tenderness and no frontal sinus tenderness. Left sinus exhibits no maxillary sinus tenderness and no frontal sinus tenderness.  Mouth/Throat: Uvula is midline, oropharynx is clear and moist and mucous membranes are normal.  Eyes: Conjunctivae, EOM and lids are normal. Pupils are equal, round, and reactive to light. Lids are everted and swept, no foreign bodies found.  Neck: Trachea normal and normal range of motion. Neck supple. Carotid bruit is not present. No thyroid mass and no thyromegaly present.  Cardiovascular: Normal rate, regular rhythm, S1 normal, S2 normal, normal heart sounds, intact distal pulses and normal pulses.  Exam reveals no gallop and no friction rub.   No murmur heard. Pulmonary/Chest: Effort normal and breath sounds normal. No tachypnea. No respiratory distress. She has no decreased breath sounds. She has no wheezes. She has no rhonchi. She has no rales.  Abdominal: Soft. Normal appearance and bowel sounds are normal. There is no hepatosplenomegaly. There is tenderness in the right lower quadrant and suprapubic area. There is no rigidity, no  rebound, no guarding and no CVA tenderness.    Uterus not palpated  Neurological: She is alert.  Skin: Skin is warm, dry and intact. No rash noted.  Psychiatric: Her speech is normal and behavior is normal. Judgment and thought content normal. Her mood appears not anxious. Cognition and memory are normal. She does not exhibit a depressed mood.          Assessment &  Plan:

## 2016-11-01 NOTE — Assessment & Plan Note (Signed)
Patient considering to keep pregnancy or terminate pregnancy. Will discuss with her mother . She will call if needs referral to OB.. likely qualifies for high risk OB.

## 2016-11-01 NOTE — Assessment & Plan Note (Signed)
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 Refer to cardiology for re-eval.

## 2016-11-02 ENCOUNTER — Encounter: Payer: Self-pay | Admitting: *Deleted

## 2016-11-02 NOTE — Telephone Encounter (Signed)
Sheila Camacho notified as instructed by telephone.  She will await call from [facility] with ultrasound appointment.

## 2016-11-02 NOTE — Telephone Encounter (Signed)
Appt made and patient aware.  

## 2016-11-02 NOTE — Telephone Encounter (Signed)
Left message for [facility] to return my call.

## 2016-11-08 ENCOUNTER — Ambulatory Visit: Payer: BLUE CROSS/BLUE SHIELD

## 2016-11-10 ENCOUNTER — Encounter: Payer: Self-pay | Admitting: Cardiology

## 2016-11-10 ENCOUNTER — Ambulatory Visit (INDEPENDENT_AMBULATORY_CARE_PROVIDER_SITE_OTHER): Payer: BLUE CROSS/BLUE SHIELD | Admitting: Cardiology

## 2016-11-10 VITALS — BP 80/54 | HR 78 | Ht 65.0 in | Wt 100.5 lb

## 2016-11-10 DIAGNOSIS — R011 Cardiac murmur, unspecified: Secondary | ICD-10-CM | POA: Diagnosis not present

## 2016-11-10 NOTE — Patient Instructions (Addendum)
Testing/Procedures: Your physician has requested that you have an echocardiogram. Echocardiography is a painless test that uses sound waves to create images of your heart. It provides your doctor with information about the size and shape of your heart and how well your heart's chambers and valves are working. This procedure takes approximately one hour. There are no restrictions for this procedure.    Follow-Up: Your physician recommends that you schedule a follow-up appointment as needed. We will call you with results and if needed schedule follow up at that time.    It was a pleasure seeing you today here in the office. Please do not hesitate to give us a call back if you have any further questions. [phone]  Georgian Mcclory A. RN, BSN    Echocardiogram An echocardiogram, or echocardiography, uses sound waves (ultrasound) to produce an image of your heart. The echocardiogram is simple, painless, obtained within a short period of time, and offers valuable information to your health care provider. The images from an echocardiogram can provide information such as:  Evidence of coronary artery disease (CAD).  Heart size.  Heart muscle function.  Heart valve function.  Aneurysm detection.  Evidence of a past heart attack.  Fluid buildup around the heart.  Heart muscle thickening.  Assess heart valve function.  Tell a health care provider about:  Any allergies you have.  All medicines you are taking, including vitamins, herbs, eye drops, creams, and over-the-counter medicines.  Any problems you or family members have had with anesthetic medicines.  Any blood disorders you have.  Any surgeries you have had.  Any medical conditions you have.  Whether you are pregnant or may be pregnant. What happens before the procedure? No special preparation is needed. Eat and drink normally. What happens during the procedure?  In order to produce an image of your heart, gel will be  applied to your chest and a wand-like tool (transducer) will be moved over your chest. The gel will help transmit the sound waves from the transducer. The sound waves will harmlessly bounce off your heart to allow the heart images to be captured in real-time motion. These images will then be recorded.  You may need an IV to receive a medicine that improves the quality of the pictures. What happens after the procedure? You may return to your normal schedule including diet, activities, and medicines, unless your health care provider tells you otherwise. This information is not intended to replace advice given to you by your health care provider. Make sure you discuss any questions you have with your health care provider. Document Released: 07/01/2000 Document Revised: 02/20/2016 Document Reviewed: 03/11/2013 Elsevier Interactive Patient Education  2017 Elsevier Inc.  

## 2016-11-10 NOTE — Progress Notes (Signed)
Cardiology Office Note   Date:  11/10/2016   ID:  Sheila Camacho, DOB 09-Mar-1996, MRN [id number]  Referring Doctor:  Kerby Nora, MD   Cardiologist:   Almond Lint, MD   Reason for consultation:  Chief Complaint  Patient presents with  . other    Ref by Dr. Karie Georges.  Pt is 8-[redacted] weeks pregnantb @ high risk, prolonged QT, missing 22 chromosome, fast heart rate, SOB on exertion. Notes on file in [facility] from [facility]. Meds verablly reviewed with patient.             History of Present Illness: Sheila Camacho is a 21 y.o. female who is being seen today for the evaluation of question history of prolonged QT at the request of Excell Seltzer, MD.  Review of records from [facility] however: Dr. Durwin Glaze, Pediatric Cardiology, 04/05/2012, [facility] system: She has a history of syncope. An EKG was performed in 2010 that showed borderline prolonged QT. She therefore underwent extensive workup to rule out a long QT syndrome. She had a normal echocardiogram performed on May 27, 2009. She had a essentially normal Holter monitor results from the same day. She subsequently underwent an epinephrine infusion study on May 28, 2009 which was negative. She underwent an exercise stress test on June 17, 2009, which was within normal limits. It was felt that her extensive cardiac testing rule out prolonged QT syndrome. Her syncope was attributed to a vasovagal syncope. That she was last seen by Dr. Lahoma Crocker in 2010. She has not had any recent episodes of syncope. She does continue to have episodes of dizziness or lightheadedness. She will have rare episodes of lightheadedness with exercise. Her symptoms occur predominantly when she stopped exercising. She also has frequent lightheadedness with standing. She does not skip any meals. She reports fairly decent fluid intake but has trouble quantitating the exact amount.    Since 2013, pt has not ff'd up with Cardiology.  Records show it was recommended to ffup prn. She has not had any more syncope. Still occasionally with lightheadedness with change in positions.   No CP, SOB, palpitations. No PND, orthopnea, edema.  Pt has digeorge's syndrome    ROS:  Please see the history of present illness. Aside from mentioned under HPI, all other systems are reviewed and negative.    Past Medical History:  Diagnosis Date  . 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 HERNIA REPAIR  2005     reports that she has never smoked. She has never used smokeless tobacco. She reports that she does not drink alcohol or use drugs.   family history includes Asthma in her maternal grandmother; Mitral valve prolapse in her mother.  No family hx of SCD, or CAD  No outpatient prescriptions prior to visit.   No facility-administered medications prior to visit.      Allergies: Patient has no known allergies.    PHYSICAL EXAM: VS:  BP (!) 80/54 (BP Location: Left Arm, Patient Position: Sitting, Cuff Size: Normal)   Pulse 78   Ht  (1.651 m)   Wt 100 lb 8 oz (45.6 kg)   LMP 10/18/2016   BMI 16.72 kg/m  , Body mass index is 16.72 kg/m. Wt Readings from Last 3 Encounters:  11/10/16 100 lb 8 oz (45.6 kg)  11/01/16 98 lb  8 oz (44.7 kg)  06/16/16 100 lb (45.4 kg)    GENERAL:  well developed, well nourished, thin appearing, not in acute distress HEENT: normocephalic, pink conjunctivae, anicteric sclerae, no xanthelasma, normal dentition, oropharynx clear NECK:  no neck vein engorgement, JVP normal, no hepatojugular reflux, carotid upstroke brisk and symmetric, no bruit, no thyromegaly, no lymphadenopathy LUNGS:  good respiratory effort, clear to auscultation bilaterally CV:  PMI not displaced, no thrills, no lifts, S1 and S2 within normal limits, no palpable S3 or S4,soft systolic murmur,  no rubs, no gallops ABD:  Soft, nontender, nondistended, normoactive bowel sounds, no abdominal aortic bruit, no hepatomegaly, no splenomegaly MS: nontender back, no kyphosis, no scoliosis, no joint deformities EXT:  2+ DP/PT pulses, no edema, no varicosities, no cyanosis, no clubbing SKIN: warm, nondiaphoretic, normal turgor, no ulcers NEUROPSYCH: alert, oriented to person, place, and time, sensory/motor grossly intact, normal mood, appropriate affect  Recent Labs: No results found for requested labs within last 8760 hours.   Lipid Panel    Component Value Date/Time   CHOL (H) 09/10/2010 0655    185        ATP III CLASSIFICATION:  <200     mg/dL   Desirable  324-401  mg/dL   Borderline High  >=027    mg/dL   High          TRIG 85 09/10/2010 0655   HDL 56 09/10/2010 0655   CHOLHDL 3.3 09/10/2010 0655   VLDL 17 09/10/2010 0655   LDLCALC (H) 09/10/2010 0655    112        Total Cholesterol/HDL:CHD Risk Coronary Heart Disease Risk Table                     Men   Women  1/2 Average Risk   3.4   3.3  Average Risk       5.0   4.4  2 X Average Risk   9.6   7.1  3 X Average Risk  23.4   11.0        Use the calculated Patient Ratio above and the CHD Risk Table to determine the patient's CHD Risk.        ATP III CLASSIFICATION (LDL):  <100     mg/dL   Optimal  253-664  mg/dL   Near or Above                    Optimal  130-159  mg/dL   Borderline  403-474  mg/dL   High  >259     mg/dL   Very High     Other studies Reviewed:  EKG:  The ekg from 11/10/2016 was personally reviewed by me and it revealed SR 74, QT , QTc . wnl.  Additional studies/ records that were reviewed personally reviewed by me today include: none available   ASSESSMENT AND PLAN: Per review of records of pediatric cardiologist, no evidence of prolonged QT or long QT syndrome Hx of syncope in the past (2010) was more orthostatic No episodes of passing out recently  Systolic murmur rec  echo   Labs/ tests ordered today include:  Orders Placed This Encounter  Procedures  . ECHOCARDIOGRAM COMPLETE     Disposition:   FU with Cardiology after tests   Thank you for this consultation. We will forwarding this consultation to referring physician.   I spent at least 40 minutes with the patient today and more than 50% of the time  was spent counseling the patient and coordinating care.   Signed, Almond Lint, MD  11/10/2016 10:51 PM    [facility]  This note was generated in part with voice recognition software and I apologize for any typographical errors that were not detected and corrected.

## 2016-11-15 NOTE — Addendum Note (Signed)
Addended by: Festus Aloe on: 11/15/2016 03:14 PM   Modules accepted: Orders

## 2016-11-21 DIAGNOSIS — F4310 Post-traumatic stress disorder, unspecified: Secondary | ICD-10-CM | POA: Diagnosis not present

## 2016-11-21 DIAGNOSIS — F909 Attention-deficit hyperactivity disorder, unspecified type: Secondary | ICD-10-CM | POA: Diagnosis not present

## 2016-11-25 ENCOUNTER — Encounter: Payer: Self-pay | Admitting: Family Medicine

## 2016-11-25 ENCOUNTER — Ambulatory Visit (INDEPENDENT_AMBULATORY_CARE_PROVIDER_SITE_OTHER): Payer: BLUE CROSS/BLUE SHIELD | Admitting: Family Medicine

## 2016-11-25 VITALS — BP 90/50 | HR 95 | Temp 98.3°F | Ht 65.0 in | Wt 104.8 lb

## 2016-11-25 DIAGNOSIS — Z3490 Encounter for supervision of normal pregnancy, unspecified, unspecified trimester: Secondary | ICD-10-CM

## 2016-11-25 DIAGNOSIS — R636 Underweight: Secondary | ICD-10-CM | POA: Diagnosis not present

## 2016-11-25 LAB — COMPREHENSIVE METABOLIC PANEL
ALBUMIN: 3.9 g/dL (ref 3.5–5.2)
ALK PHOS: 48 U/L (ref 39–117)
ALT: 6 U/L (ref 0–35)
AST: 9 U/L (ref 0–37)
BUN: 6 mg/dL (ref 6–23)
CALCIUM: 8.7 mg/dL (ref 8.4–10.5)
CHLORIDE: 106 meq/L (ref 96–112)
CO2: 24 mEq/L (ref 19–32)
Creatinine, Ser: 0.64 mg/dL (ref 0.40–1.20)
GFR: 124.96 mL/min (ref >60.00)
Glucose, Bld: 55 mg/dL — ABNORMAL LOW (ref 70–99)
POTASSIUM: 3.6 meq/L (ref 3.5–5.1)
Sodium: 139 mEq/L (ref 135–145)
TOTAL PROTEIN: 6.7 g/dL (ref 6.0–8.3)
Total Bilirubin: 0.2 mg/dL (ref 0.2–1.2)

## 2016-11-25 LAB — T3, FREE: T3, Free: 2.7 pg/mL (ref 2.3–4.2)

## 2016-11-25 LAB — T4, FREE: Free T4: 0.68 ng/dL (ref 0.60–1.60)

## 2016-11-25 LAB — TSH: TSH: 1.29 u[IU]/mL (ref 0.35–5.50)

## 2016-11-25 NOTE — Assessment & Plan Note (Signed)
Discussed healthy eating. Set up plan.  May need to re-eval mood or consider remeron for appetite stimulation.  Eval with labs for secondary cause.  Follow up in 3 months.

## 2016-11-25 NOTE — Progress Notes (Signed)
   Subjective:    Patient ID: Sheila Camacho, female    DOB: 12/25/1995, 20 y.o.   MRN: [id number]  HPI    20 year old female pt currently pregnant with planned elective abortion on 4/28 presents for trouble gaining weight.  She has been having blood clots pass vaginally.. Routine.  She has had some improvement in abd pain. Using ibuprofen and heating pad.  She is now on sprintec for birth control.   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  She is chronically underweight. Body mass index is 17.43 kg/m.   She skips meals, eats snacks all day.   She is currently seeing therapsit for depression and anxiety.  Blood pressure (!) 90/50, pulse 95, temperature 98.3 F (36.8 C), temperature source Oral, height 5' 5" (1.651 m), weight 104 lb 12 oz (47.5 kg).   Review of Systems  Constitutional: Positive for fatigue. Negative for fever.  HENT: Negative for ear pain.   Eyes: Negative for pain.  Respiratory: Negative for chest tightness and shortness of breath.   Cardiovascular: Negative for chest pain, palpitations and leg swelling.  Gastrointestinal: Negative for abdominal pain.  Genitourinary: Positive for vaginal bleeding. Negative for dysuria.       Objective:   Physical Exam  Constitutional: Vital signs are normal. She appears well-developed and well-nourished. She is cooperative.  Non-toxic appearance. She does not appear ill. No distress.  Thin appearing female in NAD  DiGeorge facies  HENT:  Head: Normocephalic.  Right Ear: Hearing, tympanic membrane, external ear and ear canal normal. Tympanic membrane is not erythematous, not retracted and not bulging.  Left Ear: Hearing, tympanic membrane, external ear and ear canal normal. Tympanic membrane is not erythematous, not retracted and not bulging.  Nose: No mucosal edema or rhinorrhea. Right sinus exhibits no maxillary sinus tenderness and no frontal sinus tenderness. Left sinus exhibits no  maxillary sinus tenderness and no frontal sinus tenderness.  Mouth/Throat: Uvula is midline, oropharynx is clear and moist and mucous membranes are normal.  Eyes: Conjunctivae, EOM and lids are normal. Pupils are equal, round, and reactive to light. Lids are everted and swept, no foreign bodies found.  Neck: Trachea normal and normal range of motion. Neck supple. Carotid bruit is not present. No thyroid mass and no thyromegaly present.  Cardiovascular: Normal rate, regular rhythm, S1 normal, S2 normal, normal heart sounds, intact distal pulses and normal pulses.  Exam reveals no gallop and no friction rub.   No murmur heard. Pulmonary/Chest: Effort normal and breath sounds normal. No tachypnea. No respiratory distress. She has no decreased breath sounds. She has no wheezes. She has no rhonchi. She has no rales.  Abdominal: Soft. Normal appearance and bowel sounds are normal. There is no tenderness.  Neurological: She is alert.  Skin: Skin is warm, dry and intact. No rash noted.  Psychiatric: Her speech is normal and behavior is normal. Judgment and thought content normal. Her mood appears not anxious. Cognition and memory are normal. She does not exhibit a depressed mood.          Assessment & Plan:   

## 2016-11-25 NOTE — Patient Instructions (Addendum)
Goal to eat 3 meals a day, snack in between or 5 small meals daily, add protein to each meal.  Try goal of  4x16 oz bottle of water daily.  Work on getting more veggies and fruit.   Please stop at the lab to set up to have labs drawn.

## 2016-11-25 NOTE — Progress Notes (Signed)
Pre visit review using our clinic review tool, if applicable. No additional management support is needed unless otherwise documented below in the visit note. 

## 2016-11-25 NOTE — Assessment & Plan Note (Signed)
S/P elevctive abortion. Currently with vag bleeding as expected.

## 2016-11-28 ENCOUNTER — Encounter: Payer: Self-pay | Admitting: *Deleted

## 2016-11-28 LAB — PREALBUMIN: Prealbumin: 18 mg/dL (ref 17–34)

## 2016-12-05 DIAGNOSIS — F4310 Post-traumatic stress disorder, unspecified: Secondary | ICD-10-CM | POA: Diagnosis not present

## 2016-12-05 DIAGNOSIS — F909 Attention-deficit hyperactivity disorder, unspecified type: Secondary | ICD-10-CM | POA: Diagnosis not present

## 2016-12-15 ENCOUNTER — Other Ambulatory Visit: Payer: Self-pay

## 2016-12-15 ENCOUNTER — Ambulatory Visit (INDEPENDENT_AMBULATORY_CARE_PROVIDER_SITE_OTHER): Payer: BLUE CROSS/BLUE SHIELD

## 2016-12-15 DIAGNOSIS — R011 Cardiac murmur, unspecified: Secondary | ICD-10-CM

## 2017-01-12 ENCOUNTER — Ambulatory Visit: Payer: Self-pay | Admitting: Family Medicine

## 2017-01-12 DIAGNOSIS — Z0289 Encounter for other administrative examinations: Secondary | ICD-10-CM

## 2017-01-13 ENCOUNTER — Telehealth: Payer: Self-pay | Admitting: Family Medicine

## 2017-01-13 NOTE — Telephone Encounter (Signed)
Change, no immediate follow up needed

## 2017-01-13 NOTE — Telephone Encounter (Signed)
Patient did not come in for their appointment on 01/12/17 for pregnancy blood test and options for weight gain Please let me know if patient needs to be contacted immediately for follow up or no follow up needed. Do you want to charge the NSF? Pt has follow up scheduled for 08/09

## 2017-01-14 DIAGNOSIS — N7689 Other specified inflammation of vagina and vulva: Secondary | ICD-10-CM | POA: Diagnosis not present

## 2017-01-14 DIAGNOSIS — R300 Dysuria: Secondary | ICD-10-CM | POA: Diagnosis not present

## 2017-02-09 ENCOUNTER — Encounter: Payer: Self-pay | Admitting: Family Medicine

## 2017-02-09 ENCOUNTER — Ambulatory Visit (INDEPENDENT_AMBULATORY_CARE_PROVIDER_SITE_OTHER): Payer: BLUE CROSS/BLUE SHIELD | Admitting: Family Medicine

## 2017-02-09 VITALS — BP 90/70 | HR 79 | Temp 98.5°F | Ht 65.0 in | Wt 98.0 lb

## 2017-02-09 DIAGNOSIS — R636 Underweight: Secondary | ICD-10-CM

## 2017-02-09 DIAGNOSIS — F321 Major depressive disorder, single episode, moderate: Secondary | ICD-10-CM | POA: Diagnosis not present

## 2017-02-09 DIAGNOSIS — Q935 Other deletions of part of a chromosome: Secondary | ICD-10-CM

## 2017-02-09 DIAGNOSIS — Q9359 Other deletions of part of a chromosome: Secondary | ICD-10-CM

## 2017-02-09 MED ORDER — MIRTAZAPINE 15 MG PO TABS: 15.0000 mg | ORAL_TABLET | Freq: Every day | ORAL | 5 refills | Status: DC

## 2017-02-09 NOTE — Assessment & Plan Note (Signed)
Treat with Remeron as SE to SSRI in past. Continue therapist.

## 2017-02-09 NOTE — Assessment & Plan Note (Signed)
Start Remeron to increase appetite. Encouraged healthy eating habits. Close follow up.

## 2017-02-09 NOTE — Patient Instructions (Addendum)
Start remeron at bedtime.  Continue work on 3 meals a day, healthy snacks.  Don't skip meals.

## 2017-02-09 NOTE — Progress Notes (Signed)
   Subjective:    Patient ID: Sheila Camacho, female    DOB: February 06, 1996, 21 y.o.   MRN: [id number]  HPI    21 year old female presents for follow up on low BMI and mood issues.  She continues to have low appetite.. Still skipping meals. She has tried protein shakes. She is ready to try Remeron to stimulate her appetite.  More irritable, impatient and issues with anger. No real depression, but very moody. No motivation  Seeing therapist but not regularly.  PHQ8   In past zoloft made her worse.    Wt Readings from Last 3 Encounters:  02/09/17 98 lb (44.5 kg)  11/25/16 104 lb 12 oz (47.5 kg)  11/10/16 100 lb 8 oz (45.6 kg)  Body mass index is 16.31 kg/m.   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 She is chronically underweight.   Review of Systems  Constitutional: Negative for fatigue and fever.  HENT: Negative for ear pain.   Eyes: Negative for pain.  Respiratory: Negative for chest tightness and shortness of breath.   Cardiovascular: Negative for chest pain, palpitations and leg swelling.  Gastrointestinal: Negative for abdominal pain.  Genitourinary: Negative for dysuria.    Blood pressure 90/70, pulse 79, temperature 98.5 F (36.9 C), temperature source Oral, height '5\' 5"'$  (1.651 m), weight 98 lb (44.5 kg).      Objective:   Physical Exam  Constitutional: Vital signs are normal. She appears well-developed and well-nourished. She is cooperative.  Non-toxic appearance. She does not appear ill. No distress.  Thin appearing female in NAD  DiGeorge facies  HENT:  Head: Normocephalic.  Right Ear: Hearing, tympanic membrane, external ear and ear canal normal. Tympanic membrane is not erythematous, not retracted and not bulging.  Left Ear: Hearing, tympanic membrane, external ear and ear canal normal. Tympanic membrane is not erythematous, not retracted and not bulging.  Nose: No mucosal edema or rhinorrhea. Right sinus exhibits no  maxillary sinus tenderness and no frontal sinus tenderness. Left sinus exhibits no maxillary sinus tenderness and no frontal sinus tenderness.  Mouth/Throat: Uvula is midline, oropharynx is clear and moist and mucous membranes are normal.  Eyes: Pupils are equal, round, and reactive to light. Conjunctivae, EOM and lids are normal. Lids are everted and swept, no foreign bodies found.  Neck: Trachea normal and normal range of motion. Neck supple. Carotid bruit is not present. No thyroid mass and no thyromegaly present.  Cardiovascular: Normal rate, regular rhythm, S1 normal, S2 normal, normal heart sounds, intact distal pulses and normal pulses.  Exam reveals no gallop and no friction rub.   No murmur heard. Pulmonary/Chest: Effort normal and breath sounds normal. No tachypnea. No respiratory distress. She has no decreased breath sounds. She has no wheezes. She has no rhonchi. She has no rales.  Abdominal: Soft. Normal appearance and bowel sounds are normal. There is no tenderness.  Neurological: She is alert.  Skin: Skin is warm, dry and intact. No rash noted.  Psychiatric: Her speech is normal. Judgment and thought content normal. Her mood appears not anxious. Her affect is labile. She is agitated. Cognition and memory are normal. She does not exhibit a depressed mood.  irritable          Assessment & Plan:

## 2017-02-17 ENCOUNTER — Ambulatory Visit: Payer: BLUE CROSS/BLUE SHIELD | Admitting: Family Medicine

## 2017-02-23 ENCOUNTER — Ambulatory Visit: Payer: BLUE CROSS/BLUE SHIELD | Admitting: Family Medicine

## 2017-02-23 DIAGNOSIS — Z0289 Encounter for other administrative examinations: Secondary | ICD-10-CM

## 2017-03-27 ENCOUNTER — Other Ambulatory Visit: Payer: Self-pay | Admitting: Family Medicine

## 2017-03-27 NOTE — Telephone Encounter (Signed)
Last office visit 02/09/2017.  Not on current medication list.  Ok to refill?

## 2017-03-28 ENCOUNTER — Ambulatory Visit (INDEPENDENT_AMBULATORY_CARE_PROVIDER_SITE_OTHER): Payer: BLUE CROSS/BLUE SHIELD | Admitting: Family Medicine

## 2017-03-28 ENCOUNTER — Ambulatory Visit
Admission: RE | Admit: 2017-03-28 | Discharge: 2017-03-28 | Disposition: A | Discharge status: Home / Self Care | Payer: BLUE CROSS/BLUE SHIELD | Source: Ambulatory Visit | Attending: Family Medicine | Admitting: Family Medicine

## 2017-03-28 ENCOUNTER — Encounter: Payer: Self-pay | Admitting: Family Medicine

## 2017-03-28 VITALS — BP 94/52 | HR 108 | Temp 98.4°F | Ht 65.0 in | Wt 101.0 lb

## 2017-03-28 DIAGNOSIS — R1030 Lower abdominal pain, unspecified: Secondary | ICD-10-CM | POA: Insufficient documentation

## 2017-03-28 DIAGNOSIS — Z3201 Encounter for pregnancy test, result positive: Secondary | ICD-10-CM | POA: Diagnosis not present

## 2017-03-28 DIAGNOSIS — O26891 Other specified pregnancy related conditions, first trimester: Secondary | ICD-10-CM | POA: Diagnosis not present

## 2017-03-28 DIAGNOSIS — Z3A01 Less than 8 weeks gestation of pregnancy: Secondary | ICD-10-CM | POA: Diagnosis not present

## 2017-03-28 LAB — CBC WITH DIFFERENTIAL/PLATELET
BASOS PCT: 0.4 % (ref 0.0–3.0)
Basophils Absolute: 0 10*3/uL (ref 0.0–0.1)
EOS PCT: 1.1 % (ref 0.0–5.0)
Eosinophils Absolute: 0.1 10*3/uL (ref 0.0–0.7)
HEMATOCRIT: 36.3 % (ref 36.0–46.0)
HEMOGLOBIN: 12.4 g/dL (ref 12.0–15.0)
LYMPHS PCT: 19.3 % (ref 12.0–46.0)
Lymphs Abs: 1.1 10*3/uL (ref 0.7–4.0)
MCHC: 34.2 g/dL (ref 30.0–36.0)
MCV: 91.1 fl (ref 78.0–100.0)
MONOS PCT: 8.9 % (ref 3.0–12.0)
Monocytes Absolute: 0.5 10*3/uL (ref 0.1–1.0)
Neutro Abs: 4.1 10*3/uL (ref 1.4–7.7)
Neutrophils Relative %: 70.3 % (ref 43.0–77.0)
Platelets: 174 10*3/uL (ref 150.0–400.0)
RBC: 3.99 Mil/uL (ref 3.87–5.11)
RDW: 13 % (ref 11.5–14.6)
WBC: 5.8 10*3/uL (ref 4.5–10.5)

## 2017-03-28 LAB — POC URINALSYSI DIPSTICK (AUTOMATED)
BILIRUBIN UA: NEGATIVE
Blood, UA: NEGATIVE
Glucose, UA: NEGATIVE
Ketones, UA: NEGATIVE
Leukocytes, UA: NEGATIVE
NITRITE UA: NEGATIVE
Spec Grav, UA: >=1.03 — AB (ref 1.010–1.025)
UROBILINOGEN UA: 1 U/dL
pH, UA: 6 (ref 5.0–8.0)

## 2017-03-28 LAB — POCT URINE PREGNANCY: PREG TEST UR: POSITIVE — AB

## 2017-03-28 LAB — HCG, QUANTITATIVE, PREGNANCY: Quantitative HCG: 158531 m[IU]/mL

## 2017-03-28 NOTE — Assessment & Plan Note (Signed)
2 pregnancy in last  6 months... Despite OCPs ( I do not believe she is taking regularly) pt with some developmental delay.  Discussed that once current issue resolved.. Will talk about depo of IUD.Marland Kitchen. Should use condoms as well. Discussed abstinence as well.

## 2017-03-28 NOTE — Patient Instructions (Addendum)
Please stop at the lab to have labs drawn. Please stop at the front desk to set up referral.  

## 2017-03-28 NOTE — Assessment & Plan Note (Addendum)
In light of positive preg test and low abd pain.. Need eval for ectopic preg with stat BHCG quant and stat transvaginal US.   Notified mother and grandmother about urgency of situation.

## 2017-03-28 NOTE — Progress Notes (Signed)
Subjective:    Patient ID: Sheila Camacho, female    DOB: [birth year], 21 y.o.   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   She reports in last month she noted no menses, then started spotting. Now stopped  She has low abdominal pain, noted milder in last month.  Constant and more severe in last 3 days.  Right greater on left.  Emesis 3 days ago once, none since.  Decreased appetite with this pain.  No diarrhea, no constipation.  No fever,  No blood in stool.  no change in vaginal discharge.  She has taken pregnancy test.. One positive and one negative.  no  dysuria.   She is taking Sprintec.. Only missed one dose, but got off so did not finish pack. She is sexually active. On partner. She is interested in getting IUD placed.  She is underweight but has gained weight in last 2 months.  Still very low BMI. Body mass index is 16.81 kg/m.  Wt Readings from Last 3 Encounters:  03/28/17 101 lb (45.8 kg)  02/09/17 98 lb (44.5 kg)  11/25/16 104 lb 12 oz (47.5 kg)    Blood pressure (!) 94/52, pulse (!) 108, temperature 98.4 F (36.9 C), temperature source Oral, height 5' 5"  (1.651 m), weight 101 lb (45.8 kg).  Review of Systems  Constitutional: Negative for fatigue and fever.  HENT: Negative for ear pain.   Eyes: Negative for pain.  Respiratory: Negative for chest tightness and shortness of breath.   Cardiovascular: Negative for chest pain, palpitations and leg swelling.  Gastrointestinal: Positive for abdominal pain. Negative for blood in stool.  Genitourinary: Negative for dysuria.       Objective:   Physical Exam  Constitutional: Vital signs are normal. She appears well-developed and well-nourished. She is cooperative.  Non-toxic appearance. She does not appear ill. No distress.  Pt nontoxic sitting comfortably.  HENT:  Head: Normocephalic.  Right Ear:  Hearing, tympanic membrane, external ear and ear canal normal. Tympanic membrane is not erythematous, not retracted and not bulging.  Left Ear: Hearing, tympanic membrane, external ear and ear canal normal. Tympanic membrane is not erythematous, not retracted and not bulging.  Nose: No mucosal edema or rhinorrhea. Right sinus exhibits no maxillary sinus tenderness and no frontal sinus tenderness. Left sinus exhibits no maxillary sinus tenderness and no frontal sinus tenderness.  Mouth/Throat: Uvula is midline, oropharynx is clear and moist and mucous membranes are normal.  Eyes: Pupils are equal, round, and reactive to light. Conjunctivae, EOM and lids are normal. Lids are everted and swept, no foreign bodies found.  Neck: Trachea normal and normal range of motion. Neck supple. Carotid bruit is not present. No thyroid mass and no thyromegaly present.  Cardiovascular: Normal rate, regular rhythm, S1 normal, S2 normal, normal heart sounds, intact distal pulses and normal pulses.  Exam reveals no gallop and no friction rub.   No murmur heard. Pulmonary/Chest: Effort normal and breath sounds normal. No tachypnea. No respiratory distress. She has no decreased breath sounds. She has no wheezes. She has no rhonchi. She has no rales.  Abdominal: Soft. Normal appearance and bowel sounds are normal. There is tenderness in the right lower quadrant, suprapubic area and left lower quadrant. There is no rigidity, no rebound, no guarding and no CVA tenderness.  Neurological: She is alert.  Skin: Skin is warm, dry and intact.  No rash noted.  Psychiatric: Her speech is normal and behavior is normal. Judgment and thought content normal. Her mood appears not anxious. Cognition and memory are normal. She does not exhibit a depressed mood.          Assessment & Plan:

## 2017-03-28 NOTE — Addendum Note (Signed)
Addended by: Damita LackLORING, [name] S on: 03/28/2017 03:32 PM   Modules accepted: Orders

## 2017-04-06 ENCOUNTER — Ambulatory Visit: Payer: BLUE CROSS/BLUE SHIELD | Admitting: Family Medicine

## 2017-06-20 ENCOUNTER — Ambulatory Visit: Payer: Self-pay | Admitting: Family Medicine

## 2017-10-04 ENCOUNTER — Telehealth: Payer: Self-pay

## 2017-10-04 DIAGNOSIS — R1032 Left lower quadrant pain: Secondary | ICD-10-CM | POA: Diagnosis not present

## 2017-10-04 DIAGNOSIS — R1031 Right lower quadrant pain: Secondary | ICD-10-CM | POA: Diagnosis not present

## 2017-10-04 DIAGNOSIS — N390 Urinary tract infection, site not specified: Secondary | ICD-10-CM | POA: Diagnosis not present

## 2017-10-04 DIAGNOSIS — N946 Dysmenorrhea, unspecified: Secondary | ICD-10-CM | POA: Diagnosis not present

## 2017-10-04 DIAGNOSIS — N898 Other specified noninflammatory disorders of vagina: Secondary | ICD-10-CM | POA: Diagnosis not present

## 2017-10-04 DIAGNOSIS — R109 Unspecified abdominal pain: Secondary | ICD-10-CM | POA: Diagnosis not present

## 2017-10-04 NOTE — Telephone Encounter (Signed)
I spoke with pt; pt has upper abd pain that comes and goes; having pain on and off for couple of weeks. Pt first thought was due to menstrual cycle being on. Pain level now is 4 but earlier this morning pt was a pain level of 6. No N&V or fever; pt had pregnancy test done which was negative.pt is also on BC. Pt cannot tell eating food makes pain worse. No diarrhea or constipation; last normal BM was this morning. No blood seen in BM. Pt having frequency of urine but voiding smaller amts. No pain or burning when urinates; pt feels like she needs to urinate but when she tries to urinate she can't or voids small amt. Pt has called out of work today and needs doctors note for work. Pt said she must be seen today.[facility]. Pt does not have gas or money to get gas so cannot drive to [place]. [facility]. FYI to Dr Ermalene SearingBedsole.

## 2017-10-04 NOTE — Telephone Encounter (Signed)
Copied from CRM [id number]. Topic: Appointment Scheduling - Scheduling Inquiry for Clinic >> Oct 04, 2017 12:02 PM Oneal GroutSebastian, Jennifer S wrote: Reason for CRM: Requesting to been seen today, having stomach pains, ongoing a couple of weeks. Able to work in?

## 2017-10-05 NOTE — Telephone Encounter (Signed)
Noted  

## 2017-11-02 ENCOUNTER — Other Ambulatory Visit: Payer: Self-pay | Admitting: Family Medicine

## 2017-11-02 NOTE — Telephone Encounter (Signed)
Last office visit 03/28/2017.  Last refilled 03/28/2018 for 240 ml with 1 refill.  Ok to refill?

## 2017-11-29 ENCOUNTER — Ambulatory Visit: Payer: Self-pay

## 2017-11-29 NOTE — Telephone Encounter (Signed)
Pt has appt 11/30/17 at 10:45 with Dr Ermalene Searing. I spoke with pt; no fever and she cannot remember what type infection KC told pt she had. If condition worsens or changes prior to appt pt will go to ED. FYI to Dr Ermalene Searing.

## 2017-11-29 NOTE — Telephone Encounter (Signed)
Pt. Reports she went to [facility] 10 days ago and was treated "for an infection." Pt. Not sure what kind of infection or the name of medication she was given. Does report she took all of it. Still having abdominal pain that "comes and goes." Pain is below the umbilicus and is across her entire belly. Last BM yesterday.Reports pregnancy test negative. Voiding without difficulty. Appointment made for tomorrow. Instructed to go to ED if pain worsens. Verbalizes understanding.  Reason for Disposition . [1] MODERATE pain (e.g., interferes with normal activities) AND [2] pain comes and goes (cramps) AND [3] present > 24 hours  (Exception: pain with Vomiting or Diarrhea - see that Guideline)  Answer Assessment - Initial Assessment Questions 1. LOCATION: "Where does it hurt?"      Hurts below umbilicus, across abdomen 2. RADIATION: "Does the pain shoot anywhere else?" (e.g., chest, back)     No 3. ONSET: "When did the pain begin?" (e.g., minutes, hours or days ago)      10 days ago 4. SUDDEN: "Gradual or sudden onset?"     Sudden 5. PATTERN "Does the pain come and go, or is it constant?"    - If constant: "Is it getting better, staying the same, or worsening?"      (Note: Constant means the pain never goes away completely; most serious pain is constant and it progresses)     - If intermittent: "How long does it last?" "Do you have pain now?"     (Note: Intermittent means the pain goes away completely between bouts)     Comes and goes 6. SEVERITY: "How bad is the pain?"  (e.g., Scale 1-10; mild, moderate, or severe)   - MILD (1-3): doesn't interfere with normal activities, abdomen soft and not tender to touch    - MODERATE (4-7): interferes with normal activities or awakens from sleep, tender to touch    - SEVERE (8-10): excruciating pain, doubled over, unable to do any normal activities      8 7. RECURRENT SYMPTOM: "Have you ever had this type of abdominal pain before?" If so, ask: "When was  the last time?" and "What happened that time?"      No 8. CAUSE: "What do you think is causing the abdominal pain?"     Unsure 9. RELIEVING/AGGRAVATING FACTORS: "What makes it better or worse?" (e.g., movement, antacids, bowel movement)     No 10. OTHER SYMPTOMS: "Has there been any vomiting, diarrhea, constipation, or urine problems?"       Last BM YESTERDAY. vOIDING WELL. 11. PREGNANCY: "Is there any chance you are pregnant?" "When was your last menstrual period?"       No  Protocols used: ABDOMINAL PAIN - [facility]

## 2017-11-30 ENCOUNTER — Encounter: Payer: Self-pay | Admitting: Family Medicine

## 2017-11-30 ENCOUNTER — Ambulatory Visit: Payer: BLUE CROSS/BLUE SHIELD | Admitting: Family Medicine

## 2017-11-30 ENCOUNTER — Telehealth: Payer: Self-pay | Admitting: Family Medicine

## 2017-11-30 ENCOUNTER — Telehealth: Payer: Self-pay

## 2017-11-30 VITALS — BP 102/68 | HR 113 | Temp 98.2°F | Ht 65.0 in | Wt 102.8 lb

## 2017-11-30 DIAGNOSIS — R1030 Lower abdominal pain, unspecified: Secondary | ICD-10-CM | POA: Diagnosis not present

## 2017-11-30 DIAGNOSIS — Q9359 Other deletions of part of a chromosome: Secondary | ICD-10-CM | POA: Diagnosis not present

## 2017-11-30 DIAGNOSIS — Z3201 Encounter for pregnancy test, result positive: Secondary | ICD-10-CM

## 2017-11-30 LAB — POC URINALSYSI DIPSTICK (AUTOMATED)
BILIRUBIN UA: NEGATIVE
GLUCOSE UA: NEGATIVE
Ketones, UA: NEGATIVE
Nitrite, UA: NEGATIVE
PH UA: 6 (ref 5.0–8.0)
Protein, UA: NEGATIVE
Spec Grav, UA: 1.015 (ref 1.010–1.025)
Urobilinogen, UA: 0.2 E.U./dL

## 2017-11-30 LAB — POCT URINE PREGNANCY: Preg Test, Ur: POSITIVE — AB

## 2017-11-30 LAB — HCG, QUANTITATIVE, PREGNANCY: Quantitative HCG: 654.67 m[IU]/mL

## 2017-11-30 NOTE — Telephone Encounter (Signed)
Copied from CRM [id number]. Topic: Quick Communication - Rx Refill/Question >> Nov 30, 2017 12:52 PM Laural Benes, [place] C wrote: Medication: SPRINTEC 28 0.25-35 MG-MCG tablet - pt says that she was just seen and is requesting to have Rx sent in to pharmacy.   Has the patient contacted their pharmacy? No   (Agent: If no, request that the patient contact the pharmacy for the refill.)  Preferred Pharmacy (with phone number or street name): [facility] - [place], [place] - [address] Agent: Please be advised that RX refills may take up to 3 business days. We ask that you follow-up with your pharmacy.

## 2017-11-30 NOTE — Patient Instructions (Addendum)
We will send urine for culture.  Stop at lab for serum pregnancy test to verify.

## 2017-11-30 NOTE — Telephone Encounter (Signed)
Let pt know labs will not be back until later today or tommorow. Will refill med for birth control if negative.

## 2017-11-30 NOTE — Telephone Encounter (Signed)
Pt calling checking on status of refill and her labs from today 11/30/17.

## 2017-11-30 NOTE — Telephone Encounter (Signed)
Lm on pts vm requesting a call back. pls advise of Dr Albertina Senegal remarks should pt return call

## 2017-11-30 NOTE — Telephone Encounter (Signed)
Called in for pregnancy test results.  They are not in yet.   Waiting for the HCG results to come in.  Pt wanting to continue on the birth control pills. After talking with Marcelline Mates the flow coordinator before informing pt of any results regarding pt wanting to continue the [facility] pills - Dr. Ermalene Searing does not want to restart the [facility] pills until after the HCG results are back.     I let the pt know Dr. Ermalene Searing is not going to renew the [facility] pills until after her pregnancy test results come back either this evening or tomorrow.    She stated,  "I want to stay on the birth control pills because even if it's positive I'm not going to keep the baby".   "I can't afford to keep it".   She strongly and repeatedly kept asking for the birth control to be renewed.     I contacted Marcelline Mates again regarding the pt's desire to continue on the [facility] pills even if she is pregnant because of not wanting to keep the baby.   Again I was informed Dr. Ermalene Searing is not going to renew the [facility] pills until the results come in of the pregnancy test.  I let the pt know again that Dr. Ermalene Searing is not going to renew the [facility] pills until the test results are back.   I informed the pt that the they are expecting the results to be in  Either this evening or tomorrow.   She stated,  "I just want to get a head start".  " Can't she renew the prescription for me?"   I let her know again that Dr. Ermalene Searing is not going to renew the [facility] pills. I then emphasized with her that the results are expected to be back by tomorrow.  If she is worried then the best thing is to not be sexually active until she finds out the test results.   She replied,   "I can't be pregnant". " I just had my period in April."  I still emphasized that not being sexually active is the best course of action until she knows the test results since the [facility] pills are not going to be renewed.  She finally replied,  "OK".    I let her know someone will contact her with the results.   She was hesitant  but finally agreeable.

## 2017-11-30 NOTE — Telephone Encounter (Signed)
Noted.  Agree with no sex until serum BHCG returns.  Pt likely currently pregnant ( positive upreg) and if not willing to continue pregnancy she will need to be seen at planned pregnancy. Taking birth control will not be adequate to terminate the pregnancy.   100% agree she needs birth control but not until current pregnancy verified and terminated if that is her wish.  Clearly not 100 % compliant given at appt agreed she had stopped OCP for a time in April. Ran out? Depo injection after abortion like best course given pt refuses IUD.   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 Will likely need to communicate with mother or grandmother if pt agreeable to make sure plan enforced.

## 2017-11-30 NOTE — Telephone Encounter (Signed)
Pt was seen by Dr Ermalene Searing today, 11/30/17 and pt had a serum pregnancy test that has not resulted yet.Please advise.

## 2017-11-30 NOTE — Progress Notes (Signed)
   Subjective:    Patient ID: Sheila Camacho, female    DOB: [birth year], 22 y.o.   MRN: [id number]  HPI   22 year old female presents with new onset abdominal pain x 10 days.  Sudden onset lower abdominal pain, sharp intermittent.  Bilateral.  No associated symtpoms.  She has been under a lot of stress lately.  Started few days prior menses, worsened during menses. She missed her early May menses. No D, NO C, no fever, no dysuria. Pain is better with caffeine.. She does drink a lot of soda.  No worse after eating.  Saw at [facility]  09/2017 for similar low abd pain  Treated for UTI with  bactrim Neg urine and serum pregnancy test at [facility].  Neg STD testing.  She has been compliant with OCP.  Body mass index is 17.1 kg/m.   Review of Systems  Constitutional: Negative for fatigue and fever.  HENT: Negative for congestion.   Eyes: Negative for pain.  Respiratory: Negative for cough and shortness of breath.   Cardiovascular: Negative for chest pain, palpitations and leg swelling.  Gastrointestinal: Positive for abdominal pain. Negative for blood in stool, constipation and diarrhea.  Genitourinary: Negative for dysuria and vaginal bleeding.  Musculoskeletal: Negative for back pain.  Neurological: Negative for syncope, light-headedness and headaches.  Psychiatric/Behavioral: Negative for dysphoric mood.       Objective:   Physical Exam  Constitutional: Vital signs are normal. She appears well-developed and well-nourished. She is cooperative.  Non-toxic appearance. She does not appear ill. No distress.  HENT:  Head: Normocephalic.  Right Ear: Hearing, tympanic membrane, external ear and ear canal normal. Tympanic membrane is not erythematous, not retracted and not bulging.  Left Ear: Hearing, tympanic membrane, external ear and ear canal normal. Tympanic membrane is not erythematous, not retracted and not bulging.  Nose: No mucosal edema or rhinorrhea.  Right sinus exhibits no maxillary sinus tenderness and no frontal sinus tenderness. Left sinus exhibits no maxillary sinus tenderness and no frontal sinus tenderness.  Mouth/Throat: Uvula is midline, oropharynx is clear and moist and mucous membranes are normal.  Eyes: Pupils are equal, round, and reactive to light. Conjunctivae, EOM and lids are normal. Lids are everted and swept, no foreign bodies found.  Neck: Trachea normal and normal range of motion. Neck supple. Carotid bruit is not present. No thyroid mass and no thyromegaly present.  Cardiovascular: Normal rate, regular rhythm, S1 normal, S2 normal, normal heart sounds, intact distal pulses and normal pulses. Exam reveals no gallop and no friction rub.  No murmur heard. Pulmonary/Chest: Effort normal and breath sounds normal. No tachypnea. No respiratory distress. She has no decreased breath sounds. She has no wheezes. She has no rhonchi. She has no rales.  Abdominal: Soft. Normal appearance and bowel sounds are normal. There is tenderness in the right lower quadrant and left lower quadrant. There is no rigidity, no rebound, no guarding, no CVA tenderness and negative Murphy's sign.  Neurological: She is alert.  Skin: Skin is warm, dry and intact. No rash noted.  Psychiatric: Her speech is normal and behavior is normal. Judgment and thought content normal. Her mood appears not anxious. Cognition and memory are normal. She does not exhibit a depressed mood.          Assessment & Plan:

## 2017-12-01 LAB — URINE CULTURE
MICRO NUMBER: 90598164
SPECIMEN QUALITY: ADEQUATE

## 2017-12-01 NOTE — Telephone Encounter (Signed)
Patient is calling and states she would like to know how many weeks it showed she was pregnant by the blood test.

## 2017-12-01 NOTE — Telephone Encounter (Signed)
Dustin notified as instructed by telephone.

## 2017-12-01 NOTE — Telephone Encounter (Signed)
Cannot say exactly.. Less than 8 weeks likely.

## 2017-12-18 ENCOUNTER — Telehealth: Payer: Self-pay

## 2017-12-18 ENCOUNTER — Other Ambulatory Visit: Payer: Self-pay | Admitting: Family Medicine

## 2017-12-18 NOTE — Telephone Encounter (Signed)
Left message for Sheila Camacho to return call with update from triage note from 12/16/2017.  If patient calls back,  okay for PEC Triage to get update on how patient is doing  and was she seen over the weekend at emergency care. CRM created.

## 2017-12-18 NOTE — Telephone Encounter (Signed)
Unable to reach pt by phone for update of condition.

## 2017-12-18 NOTE — Telephone Encounter (Signed)
Patient states she had a miscarriage - patient reports she passed tissue and she is having normal bleeding now- some light clotting. She has had this happen before so she is not alarmed at the amount that she is bleeding - or the cramping that she had. Patient wants to start birth control this week. Patient states she did not go to the ED because she knew what was happening and she has been through this before. Patient reports she still uses the [facility]. Patient can be contacted at [phone] for questions if needed.

## 2017-12-18 NOTE — Telephone Encounter (Signed)
PLEASE NOTE: All timestamps contained within this report are represented as [place] Standard Time. CONFIDENTIALTY NOTICE: This fax transmission is intended only for the addressee. It contains information that is legally privileged, confidential or otherwise protected from use or disclosure. If you are not the intended recipient, you are strictly prohibited from reviewing, disclosing, copying using or disseminating any of this information or taking any action in reliance on or regarding this information. If you have received this fax in error, please notify us immediately by telephone so that we can arrange for its return to [place]. Phone: [phone], Toll-Free: [phone], Fax: [phone] Page: 1 of 2 Call Id: [id number] [facility] Night - Client TELEPHONE ADVICE RECORD [facility] Patient Name: Sheila Camacho Gender: Female DOB: 1996-06-15 Age: 22 Y 8 M 8 D Return Phone Number: [phone] (Primary) Address: City/State/Zip: [place] [address] Client [facility] Night - Client Client Site  [facility] [place] - Night Physician Kerby Nora - MD Contact Type Call Who Is Calling Patient / Member / Family / Caregiver Call Type Triage / Clinical Relationship To Patient Self Return Phone Number [phone] (Primary) Chief Complaint pregnant (<20 weeks) - vaginal bleeding, water or leaking fluids Reason for Call Symptomatic / Request for Health Information Initial Comment Caller states she is [redacted] weeks pregnant and she is having cramps and bleeding. Translation No Nurse Assessment Nurse: Stefano Gaul, RN, Vera Date/Time (Eastern Time): 12/16/2017 10:32:01 AM Confirm and document reason for call. If symptomatic, describe symptoms. ---Caller states she is [redacted] weeks pregnant. She is cramping and bleeding. She was told 1 week ago she might having a miscarriage. she is having severe abd pain in her lower abd. Does the  patient have any new or worsening symptoms? ---Yes Will a triage be completed? ---Yes Related visit to physician within the last 2 weeks? ---Yes Does the PT have any chronic conditions? (i.e. diabetes, asthma, etc.) ---No Is the patient pregnant or possibly pregnant? (Ask all females between the ages of 22-55) ---Yes How many weeks gestation? ---about 3 weeks What is the estimated delivery date? ---0001-01-01 Total number of pregnancies including current? ---3 Number of live births? ---2 Have you felt decreased fetal movement? ---Early Pregnancy - No Fetal Movement Felt Yet Is this a behavioral health or substance abuse call? ---No Guidelines Guideline Title Affirmed Question Affirmed Notes Nurse Date/Time Lamount Cohen Time) Pregnancy - Abdominal Pain Less Than [redacted] Weeks EGA MODERATE-SEVERE abdominal pain (e.g., interferes with normal Stefano Gaul, RN, Vera 12/16/2017 10:34:06 AM PLEASE NOTE: All timestamps contained within this report are represented as [place] Standard Time. CONFIDENTIALTY NOTICE: This fax transmission is intended only for the addressee. It contains information that is legally privileged, confidential or otherwise protected from use or disclosure. If you are not the intended recipient, you are strictly prohibited from reviewing, disclosing, copying using or disseminating any of this information or taking any action in reliance on or regarding this information. If you have received this fax in error, please notify us immediately by telephone so that we can arrange for its return to [place]. Phone: [phone], Toll-Free: [phone], Fax: [phone] Page: 2 of 2 Call Id: [id number] Guidelines Guideline Title Affirmed Question Affirmed Notes Nurse Date/Time Lamount Cohen Time) activities, awakens from sleep) Abortion - Threatened Miscarriage Follow-up Call SEVERE abdominal pain Stefano Gaul, RN, Dwana Curd 12/16/2017 10:42:39 AM Disp. Time Lamount Cohen Time) Disposition Final User 12/16/2017  10:40:55 AM Go to ED Now Stefano Gaul, RN, Dwana Curd 12/16/2017 10:43:09 AM Go to ED Now Yes  Stefano GaulStringer, RN, Clerance LavVera Caller Disagree/Comply Disagree Caller Understands Yes PreDisposition Did not know what to do Care Advice Given Per Guideline GO TO ED NOW: You need to be seen in the Emergency Department. Go to the ER at ___________ Hospital. Leave now. Drive carefully. DRIVING: Another adult should drive. GO TO ED NOW: You need to be seen in the Emergency Department. Go to the ER at ___________ Hospital. Leave now. Drive carefully. NOTE TO TRIAGER - DRIVING: * Another adult should drive. CARE ADVICE given per Abortion - Threatened Miscarriage Follow-Up Call (Adult) guideline. Comments User: Art BuffVera, Stringer, RN Date/Time Lamount Cohen(Eastern Time): 12/16/2017 10:41:55 AM pt states she is going to emergency care that is open 24/7 rather than ER as she can not afford to go to the ER Referrals GO TO FACILITY UNDECIDED GO TO FACILITY UNDECIDED

## 2017-12-19 NOTE — Telephone Encounter (Signed)
I spoke with the patient in detail. Discussed: I do not want to given her OCPs if she is still pregnant.  If she is not telling me the truth or is confused and she is still pregnant, OCPs can harm an unborn child and cause the fetus health issues if born.  ALSO OCPs while pregnant could increase the patients risk of DVT. She did not seem to understand my concern and in a raised voice told me that she did not like my care. She is positive that she is no longer pregnant because she is bleeding like a normal period.   After this discussion and a fervent request to come in for an appt to be examined , to make sure no dangerous bleeding following a miscarriage, I need to verify that she is no longer pregnant or retained products of conception,  has had a miscarriage and to set up birth control.... She hung up suddenly.  She has proven to not be  a reliable patient in the past. 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 She has not given me permission to talk with her family about this despite my request.  She has been pregnant  2 additional times earlier this year, once with twins and has had 2 abortions earlier this year already. She is not compliant with my recommendations to use DEPO for birth control which allows her to not have to remember OCPs.   I informed her  On the phone that if she is unhappy with my care and recommendations.. She can find another non-[place] provider, or see gynecology or go to planned parenthood. I believe her disrespect for our staff and myself  (rudness and hanging up on our staff multiple times) and her noncompliance with  my recommendations  are a valid reason for consideration of dismissal.  Denny Peonrin, Please begin this process.  FYI to [facility] CMA

## 2017-12-19 NOTE — Telephone Encounter (Signed)
Please disregard note on OCP request.  Please have her make an appt to be seen by me..today or Friday to set up OCPs but I recommend Depo shot over OCPs. Will discuss then.  If okay with pt I recommend an US to assure no retained products.. If she wants to do this prior to appt I can do this, let me know.  Just remind her if she does not have sex, she cannot get pregnant.

## 2017-12-19 NOTE — Telephone Encounter (Signed)
Sheila Camacho notified as instructed by telephone.  She states she doesn't understand why it is so hard to get OCPs prescribed..  She states she doesn't have the time to come in for office visit because she is working 2 jobs.  She states [facility] will [facility] and have the prescribe them for her and hung up the phone.

## 2017-12-19 NOTE — Telephone Encounter (Signed)
Pt would like a call to discuss why she can't get her birth control pills. She has called [facility] and wants to know if they approve if Dr. Ermalene SearingBedsole will write her an rx for OCPs. She is requesting a call from Dr. Ermalene SearingBedsole and is upset. She states that "Dr. Ermalene SearingBedsole didn't have a problem prescribing me birth control pills when I had my abortions, why is it a problem now." Please advise.

## 2017-12-19 NOTE — Telephone Encounter (Signed)
Call pt.. Make sure she has terminated pregnancy or is in process. If so okay to fill.

## 2017-12-20 ENCOUNTER — Telehealth: Payer: Self-pay | Admitting: Family Medicine

## 2017-12-20 ENCOUNTER — Encounter: Payer: Self-pay | Admitting: Family Medicine

## 2017-12-20 NOTE — Telephone Encounter (Signed)
I called and spoke with the patient this morning to confirm she is aware that she is being dismissed and will not be able to see any [facility].  She stated that she is aware and has already been looking.  She stated this is all because Dr. Ermalene SearingBedsole didn't listen to her.  I attempted to explain Dr. Daphine DeutscherBedsole's reason for requesting her to come in, but the patient hung up on me before I could finish the sentence.  Will send dismissal letter today.

## 2017-12-22 NOTE — Telephone Encounter (Signed)
Patient dismissed from [facility] by Kerby NoraAmy Bedsole MD , effective December 20, 2017. Dismissal letter sent out by certified / registered mail.  daj

## 2017-12-28 NOTE — Assessment & Plan Note (Signed)
Given decrease IQ associated and recurrent unwanted pregnancy despite rx for contraception... Requested to talk with her mother. Pt refused.

## 2017-12-28 NOTE — Assessment & Plan Note (Signed)
Likely cause of abdominal pain. Send for verification ot serum pregnancy test. Will need eval with US if posisitve for determination of location of gestational sac given abdominal pain.

## 2017-12-28 NOTE — Assessment & Plan Note (Signed)
No rebound or guarding on exam.

## 2017-12-30 ENCOUNTER — Other Ambulatory Visit: Payer: Self-pay | Admitting: Family Medicine

## 2018-01-11 DIAGNOSIS — Z681 Body mass index (BMI) 19 or less, adult: Secondary | ICD-10-CM | POA: Diagnosis not present

## 2018-01-11 DIAGNOSIS — Z30013 Encounter for initial prescription of injectable contraceptive: Secondary | ICD-10-CM | POA: Diagnosis not present

## 2018-01-11 DIAGNOSIS — Q9359 Other deletions of part of a chromosome: Secondary | ICD-10-CM | POA: Diagnosis not present

## 2018-01-22 NOTE — Telephone Encounter (Signed)
Certified dismissal letter returned as undeliverable, unclaimed, return to sender after three attempts by [employer] on December 23, 2017. Letter placed in another envelope and resent as 1st class mail which does not require a signature. daj

## 2018-03-26 DIAGNOSIS — R1032 Left lower quadrant pain: Secondary | ICD-10-CM | POA: Diagnosis not present

## 2018-03-26 DIAGNOSIS — N926 Irregular menstruation, unspecified: Secondary | ICD-10-CM | POA: Diagnosis not present

## 2018-03-26 DIAGNOSIS — N760 Acute vaginitis: Secondary | ICD-10-CM | POA: Diagnosis not present

## 2018-03-26 DIAGNOSIS — R1031 Right lower quadrant pain: Secondary | ICD-10-CM | POA: Diagnosis not present

## 2018-04-05 DIAGNOSIS — Z3042 Encounter for surveillance of injectable contraceptive: Secondary | ICD-10-CM | POA: Diagnosis not present

## 2018-05-03 DIAGNOSIS — R000 Tachycardia, unspecified: Secondary | ICD-10-CM | POA: Diagnosis not present

## 2018-05-03 DIAGNOSIS — R1032 Left lower quadrant pain: Secondary | ICD-10-CM | POA: Diagnosis not present

## 2018-05-03 DIAGNOSIS — N938 Other specified abnormal uterine and vaginal bleeding: Secondary | ICD-10-CM | POA: Diagnosis not present

## 2018-05-03 DIAGNOSIS — R1031 Right lower quadrant pain: Secondary | ICD-10-CM | POA: Diagnosis not present

## 2018-05-08 DIAGNOSIS — R102 Pelvic and perineal pain: Secondary | ICD-10-CM | POA: Diagnosis not present

## 2018-05-29 DIAGNOSIS — R102 Pelvic and perineal pain: Secondary | ICD-10-CM | POA: Diagnosis not present

## 2018-06-28 ENCOUNTER — Ambulatory Visit: Payer: BLUE CROSS/BLUE SHIELD | Attending: Obstetrics & Gynecology

## 2018-07-05 ENCOUNTER — Ambulatory Visit: Payer: BLUE CROSS/BLUE SHIELD

## 2018-07-09 DIAGNOSIS — Z3042 Encounter for surveillance of injectable contraceptive: Secondary | ICD-10-CM | POA: Diagnosis not present

## 2018-08-02 ENCOUNTER — Ambulatory Visit: Payer: BLUE CROSS/BLUE SHIELD

## 2018-08-06 DIAGNOSIS — F4310 Post-traumatic stress disorder, unspecified: Secondary | ICD-10-CM | POA: Diagnosis not present

## 2018-08-06 DIAGNOSIS — F411 Generalized anxiety disorder: Secondary | ICD-10-CM | POA: Diagnosis not present

## 2018-08-06 DIAGNOSIS — Z8659 Personal history of other mental and behavioral disorders: Secondary | ICD-10-CM | POA: Diagnosis not present

## 2018-08-06 DIAGNOSIS — F329 Major depressive disorder, single episode, unspecified: Secondary | ICD-10-CM | POA: Diagnosis not present

## 2018-08-09 ENCOUNTER — Ambulatory Visit: Payer: BLUE CROSS/BLUE SHIELD

## 2018-08-16 ENCOUNTER — Ambulatory Visit: Payer: BLUE CROSS/BLUE SHIELD

## 2018-08-23 ENCOUNTER — Ambulatory Visit: Payer: BLUE CROSS/BLUE SHIELD

## 2018-08-29 DIAGNOSIS — R599 Enlarged lymph nodes, unspecified: Secondary | ICD-10-CM | POA: Diagnosis not present

## 2018-08-30 ENCOUNTER — Ambulatory Visit: Payer: BLUE CROSS/BLUE SHIELD

## 2018-09-12 DIAGNOSIS — Z8659 Personal history of other mental and behavioral disorders: Secondary | ICD-10-CM | POA: Diagnosis not present

## 2018-09-12 DIAGNOSIS — F329 Major depressive disorder, single episode, unspecified: Secondary | ICD-10-CM | POA: Diagnosis not present

## 2018-09-12 DIAGNOSIS — F411 Generalized anxiety disorder: Secondary | ICD-10-CM | POA: Diagnosis not present

## 2018-09-12 DIAGNOSIS — R599 Enlarged lymph nodes, unspecified: Secondary | ICD-10-CM | POA: Diagnosis not present

## 2018-09-12 DIAGNOSIS — F4310 Post-traumatic stress disorder, unspecified: Secondary | ICD-10-CM | POA: Diagnosis not present

## 2018-09-19 DIAGNOSIS — L298 Other pruritus: Secondary | ICD-10-CM | POA: Diagnosis not present

## 2018-09-19 DIAGNOSIS — F411 Generalized anxiety disorder: Secondary | ICD-10-CM | POA: Diagnosis not present

## 2018-09-28 DIAGNOSIS — Z3042 Encounter for surveillance of injectable contraceptive: Secondary | ICD-10-CM | POA: Diagnosis not present

## 2018-10-31 DIAGNOSIS — F411 Generalized anxiety disorder: Secondary | ICD-10-CM | POA: Diagnosis not present

## 2018-11-05 DIAGNOSIS — R7989 Other specified abnormal findings of blood chemistry: Secondary | ICD-10-CM | POA: Diagnosis not present

## 2018-12-31 DIAGNOSIS — Z3042 Encounter for surveillance of injectable contraceptive: Secondary | ICD-10-CM | POA: Diagnosis not present

## 2019-04-03 DIAGNOSIS — Z124 Encounter for screening for malignant neoplasm of cervix: Secondary | ICD-10-CM | POA: Diagnosis not present

## 2019-04-03 DIAGNOSIS — F4310 Post-traumatic stress disorder, unspecified: Secondary | ICD-10-CM | POA: Diagnosis not present

## 2019-04-03 DIAGNOSIS — Z3042 Encounter for surveillance of injectable contraceptive: Secondary | ICD-10-CM | POA: Diagnosis not present

## 2019-04-03 DIAGNOSIS — N898 Other specified noninflammatory disorders of vagina: Secondary | ICD-10-CM | POA: Diagnosis not present

## 2019-04-03 DIAGNOSIS — R87612 Low grade squamous intraepithelial lesion on cytologic smear of cervix (LGSIL): Secondary | ICD-10-CM | POA: Diagnosis not present

## 2019-04-03 DIAGNOSIS — F411 Generalized anxiety disorder: Secondary | ICD-10-CM | POA: Diagnosis not present

## 2019-04-03 DIAGNOSIS — Z0000 Encounter for general adult medical examination without abnormal findings: Secondary | ICD-10-CM | POA: Diagnosis not present

## 2019-04-03 DIAGNOSIS — F329 Major depressive disorder, single episode, unspecified: Secondary | ICD-10-CM | POA: Diagnosis not present

## 2019-04-03 DIAGNOSIS — Z23 Encounter for immunization: Secondary | ICD-10-CM | POA: Diagnosis not present

## 2019-04-03 LAB — HM PAP SMEAR

## 2019-05-07 IMAGING — US US OB COMP LESS 14 WK
1 series · 14 of 28 positions shown · non-contrast
Comparison: None.

CLINICAL DATA: Lower abdominal pain.  Early pregnancy.

EXAM:
TWIN OBSTETRIC <14WK US AND TRANSVAGINAL OB US

[Series 1: us ob comp less 14 wk · 0.14mm/px · 14 of 116 slices shown]
[im 5/116]
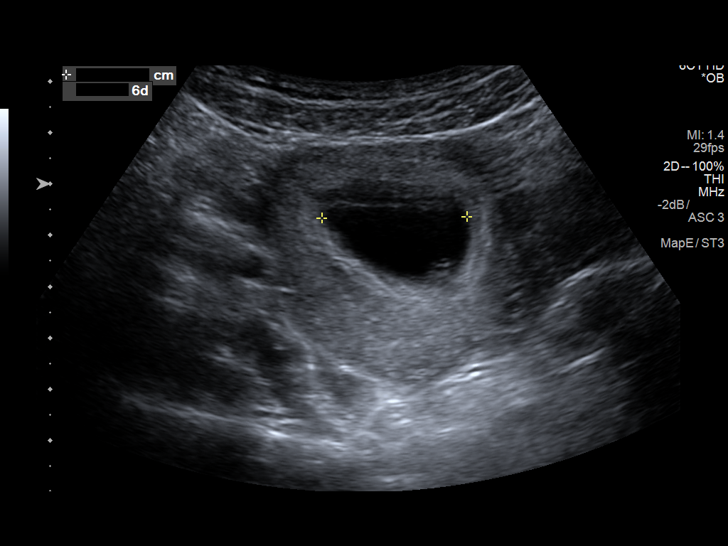
[im 13/116]
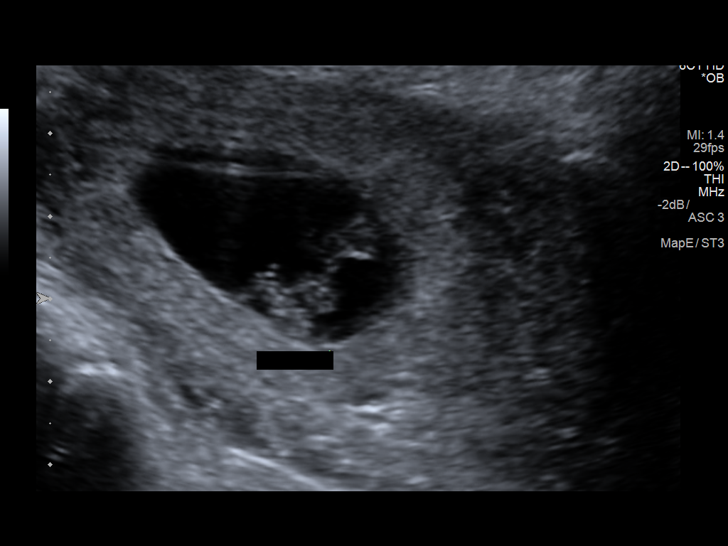
[im 22/116]
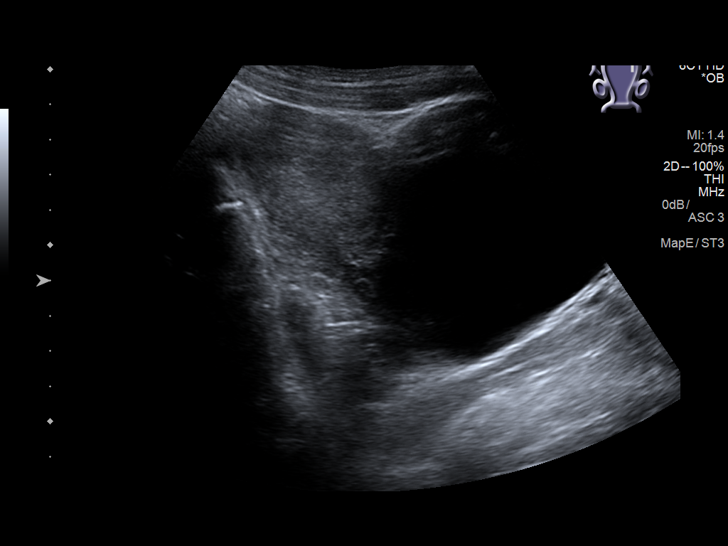
[im 30/116]
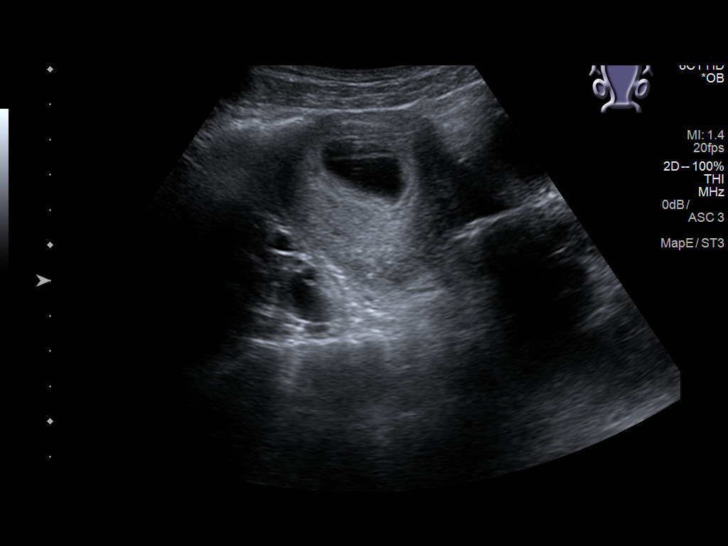
[im 39/116]
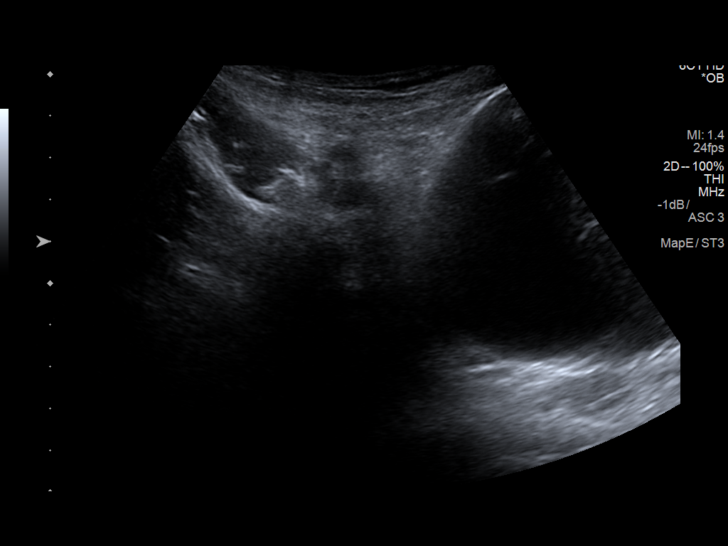
[im 47/116]
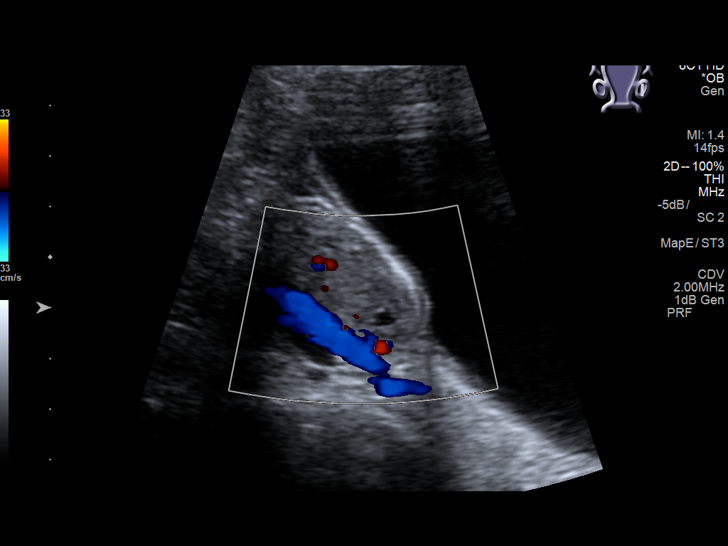
[im 56/116]
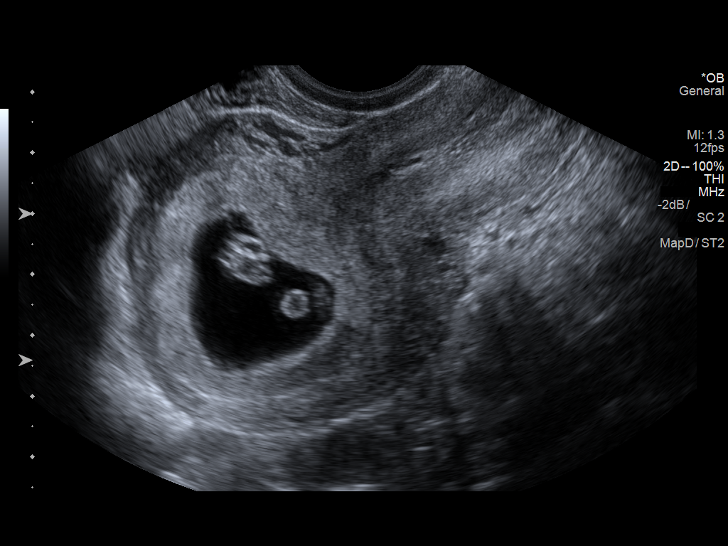
[im 64/116]
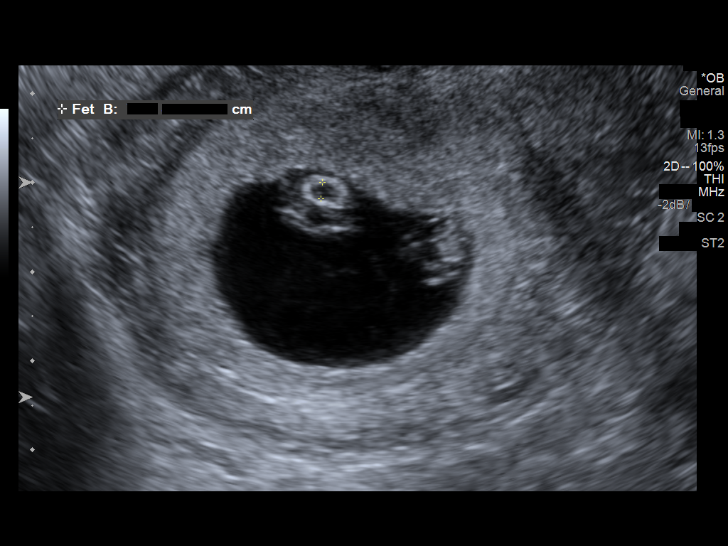
[im 73/116]
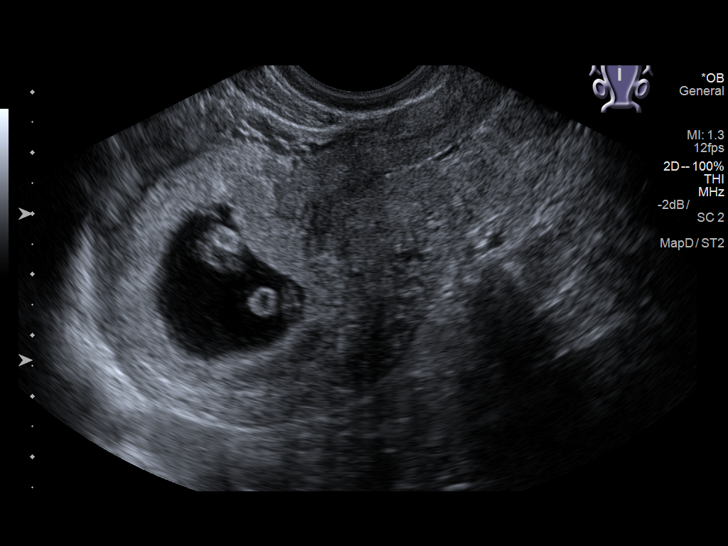
[im 81/116]
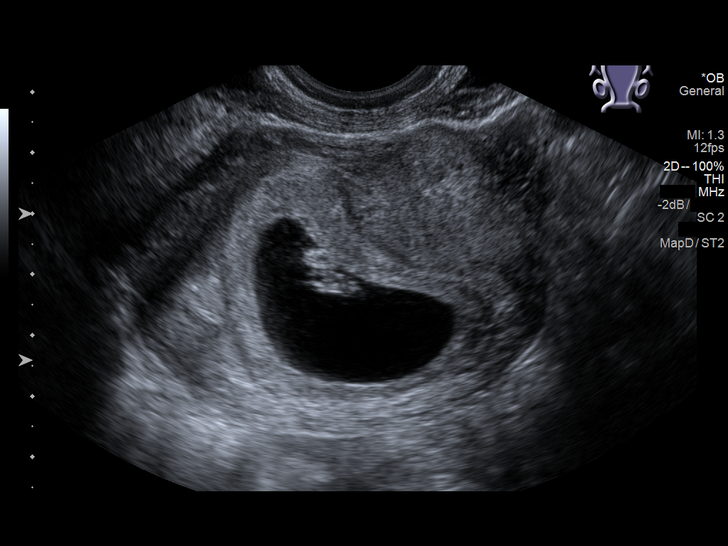
[im 90/116]
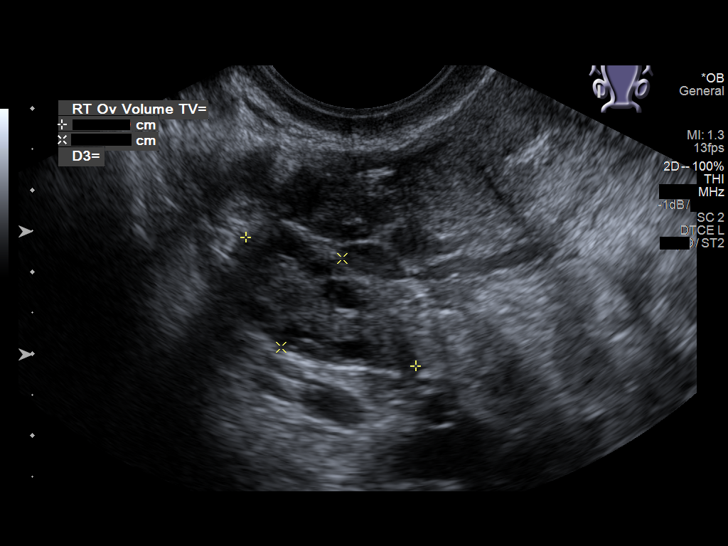
[im 98/116]
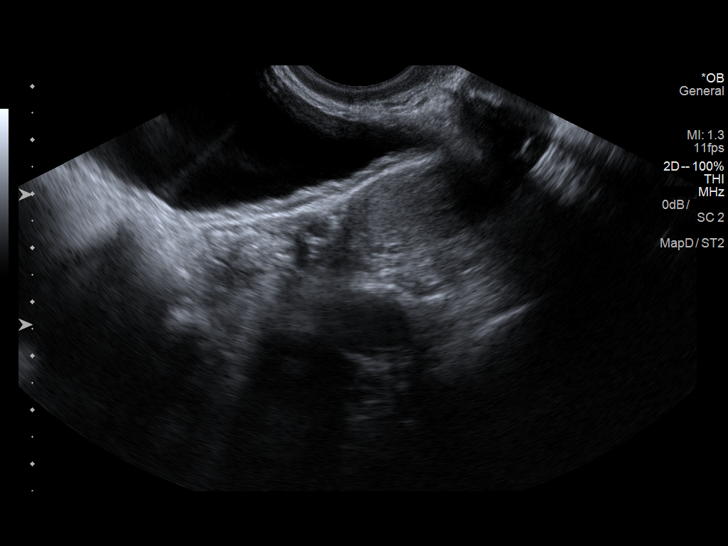
[im 107/116]
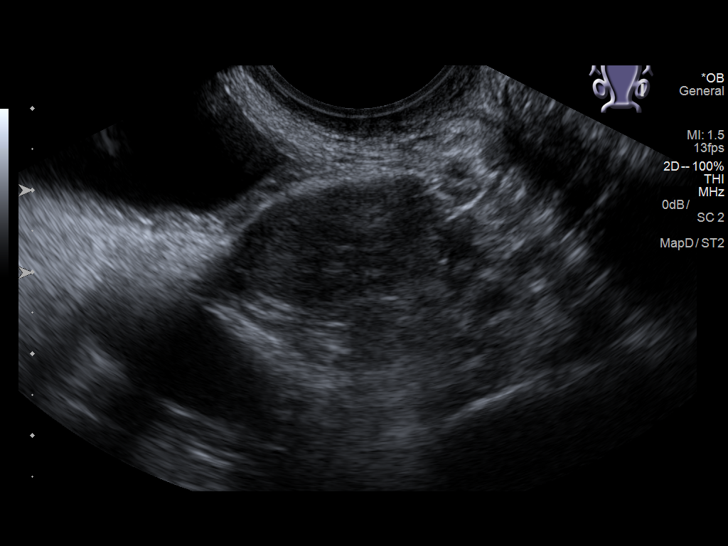
[im 116/116]
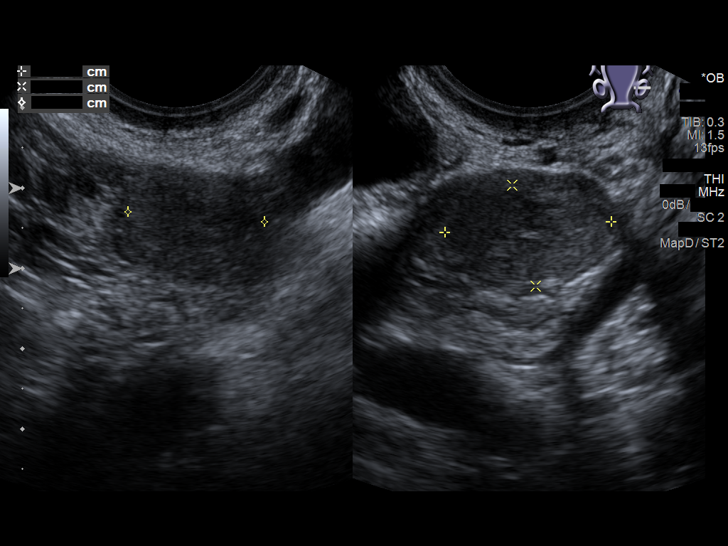

[14 of 28 positions shown; findings below may reference images not displayed]

FINDINGS: Number of IUPs:  2

Chorionicity/Amnionicity:  Monochorionic-diamniotic (thin membrane)

TWIN 1

Yolk sac:  Visualized.

Embryo:  Visualized.

Cardiac Activity: Visualized.

Heart Rate: 113 bpm

CRL:  8.2  mm   6 w 5 d                  US EDC: 11/16/2017

TWIN 2

Yolk sac:  Visualized.

Embryo:  Visualized.

Cardiac Activity: Visualized.

Heart Rate: 144 bpm

CRL:  12.4  mm   7 w 3D d                  US EDC: [REDACTED]

Subchorionic hemorrhage:  None visualized.

Maternal uterus/adnexae:

Subchorionic hemorrhage: None

Right ovary: Normal

Left ovary: Normal

Other :None

Free fluid:  None
IMPRESSION: 1. Living twin gestations identified.
2. Monochorionic and diamniotic.
3. Note the discordant gestational age between twin 1 and twin 2.

## 2019-06-29 ENCOUNTER — Other Ambulatory Visit: Payer: Self-pay

## 2019-06-29 ENCOUNTER — Encounter: Payer: Self-pay | Admitting: Emergency Medicine

## 2019-06-29 ENCOUNTER — Emergency Department: Payer: BC Managed Care – PPO

## 2019-06-29 DIAGNOSIS — R002 Palpitations: Secondary | ICD-10-CM | POA: Diagnosis not present

## 2019-06-29 DIAGNOSIS — R000 Tachycardia, unspecified: Secondary | ICD-10-CM | POA: Diagnosis not present

## 2019-06-29 DIAGNOSIS — R0602 Shortness of breath: Secondary | ICD-10-CM | POA: Diagnosis not present

## 2019-06-29 DIAGNOSIS — Z79899 Other long term (current) drug therapy: Secondary | ICD-10-CM | POA: Insufficient documentation

## 2019-06-29 DIAGNOSIS — Z20828 Contact with and (suspected) exposure to other viral communicable diseases: Secondary | ICD-10-CM | POA: Insufficient documentation

## 2019-06-29 LAB — CBC
HCT: 35.9 % — ABNORMAL LOW (ref 36.0–46.0)
Hemoglobin: 11.7 g/dL — ABNORMAL LOW (ref 12.0–15.0)
MCH: 28.7 pg (ref 26.0–34.0)
MCHC: 32.6 g/dL (ref 30.0–36.0)
MCV: 88.2 fL (ref 80.0–100.0)
Platelets: 217 10*3/uL (ref 150–400)
RBC: 4.07 MIL/uL (ref 3.87–5.11)
RDW: 12.9 % (ref 11.5–15.5)
WBC: 7.2 10*3/uL (ref 4.0–10.5)
nRBC: 0 % (ref 0.0–0.2)

## 2019-06-29 LAB — BASIC METABOLIC PANEL
Anion gap: 11 (ref 5–15)
BUN: 8 mg/dL (ref 6–20)
CO2: 21 mmol/L — ABNORMAL LOW (ref 22–32)
Calcium: 8.9 mg/dL (ref 8.9–10.3)
Chloride: 106 mmol/L (ref 98–111)
Creatinine, Ser: 0.7 mg/dL (ref 0.44–1.00)
GFR calc Af Amer: >60 mL/min (ref >60)
GFR calc non Af Amer: >60 mL/min (ref >60)
Glucose, Bld: 89 mg/dL (ref 70–99)
Potassium: 3.6 mmol/L (ref 3.5–5.1)
Sodium: 138 mmol/L (ref 135–145)

## 2019-06-29 LAB — TROPONIN I (HIGH SENSITIVITY): Troponin I (High Sensitivity): <2 ng/L (ref <18)

## 2019-06-29 MED ORDER — SODIUM CHLORIDE 0.9% FLUSH: 3.0000 mL | Freq: Once | INTRAVENOUS | Status: DC

## 2019-06-29 NOTE — ED Triage Notes (Signed)
States sensation of fast heart rate began approx 740 this evening and continues now. States feels chest tightness and SOB.

## 2019-06-30 ENCOUNTER — Emergency Department
Admission: EM | Admit: 2019-06-30 | Discharge: 2019-06-30 | Disposition: A | Discharge status: Home / Self Care | Payer: BC Managed Care – PPO | Attending: Emergency Medicine | Admitting: Emergency Medicine

## 2019-06-30 ENCOUNTER — Emergency Department: Payer: BC Managed Care – PPO

## 2019-06-30 ENCOUNTER — Encounter: Payer: Self-pay | Admitting: Radiology

## 2019-06-30 DIAGNOSIS — R0602 Shortness of breath: Secondary | ICD-10-CM | POA: Diagnosis not present

## 2019-06-30 DIAGNOSIS — R002 Palpitations: Secondary | ICD-10-CM

## 2019-06-30 LAB — T4, FREE: Free T4: 1.16 ng/dL — ABNORMAL HIGH (ref 0.61–1.12)

## 2019-06-30 LAB — POCT PREGNANCY, URINE: Preg Test, Ur: NEGATIVE

## 2019-06-30 LAB — TSH: TSH: 0.824 u[IU]/mL (ref 0.350–4.500)

## 2019-06-30 MED ORDER — IOHEXOL 350 MG/ML SOLN
75.0000 mL | Freq: Once | INTRAVENOUS | Status: AC | PRN
  Administered 2019-06-30: 75 mL via INTRAVENOUS

## 2019-06-30 MED ORDER — SODIUM CHLORIDE 0.9 % IV BOLUS
1000.0000 mL | Freq: Once | INTRAVENOUS | Status: AC
  Administered 2019-06-30: 01:00:00 1000 mL via INTRAVENOUS

## 2019-06-30 NOTE — ED Notes (Signed)
Patient transported to CT 

## 2019-06-30 NOTE — ED Provider Notes (Signed)
[facility] Emergency Department Provider Note   ____________________________________________   First MD Initiated Contact with Patient 06/30/19 0001     (approximate)  I have reviewed the triage vital signs and the nursing notes.   HISTORY  Chief Complaint Palpitations    HPI Sheila Camacho is a 23 y.o. female who presents to the ED from work with a chief complaint of palpitations, dizziness, chest tightness and shortness of breath.  Patient works as a Nurse, adult at a [employer]  She was at the end of her shift when she felt her heart beating rapidly associated with the above symptoms.  Denies recent fever, cough, abdominal pain, nausea, vomiting, diarrhea.  Does take OCPs.  Denies recent travel or trauma.  Has cut down her soda consumption from 8 Cokes per day down to 3.  Denies energy drinks, herbal medications or illicit substances.       Past Medical History:  Diagnosis Date  . DEVELOPMENTAL DELAY 03/06/2008   Annotation: 22nd chromosome deletion Qualifier: Diagnosis of  By: Diona Browner MD, Amy      Patient Active Problem List   Diagnosis Date Noted  . Positive pregnancy test 03/28/2017  . Depression, major, single episode, moderate ([place]) 02/09/2017  . Lower abdominal pain 11/01/2016  . Underweight 11/01/2016  . 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 Lichen sclerosus of female genitalia 02/25/2015  . ADHD 03/06/2008  . DEVELOPMENTAL DELAY 03/06/2008  . BUNIONS, BILATERAL 03/06/2008  . CARDIAC MURMUR, HX OF 03/06/2008    Past Surgical History:  Procedure Laterality Date  . HERNIA REPAIR  2005    Prior to Admission medications   Medication Sig Start Date End Date Taking? Authorizing Provider  DENTA 5000 PLUS 1.1 % CREA dental cream  11/07/16   [provider]  ibuprofen (ADVIL,MOTRIN) 400 MG tablet Take by mouth.    [provider]  ketoconazole (NIZORAL) 2 % shampoo WASH HAIR WITH SHAMPOO DAILY  11/06/17   Bedsole, Amy E, MD  mirtazapine (REMERON) 15 MG tablet Take 1 tablet (15 mg total) by mouth at bedtime. 02/09/17   Jinny Sanders, MD  SPRINTEC 28 0.25-35 MG-MCG tablet  11/19/16   [provider]    Allergies Patient has no known allergies.  Family History  Problem Relation Age of Onset  . Mitral valve prolapse Mother   . Asthma Maternal Grandmother   . Cancer Neg Hx     Social History Social History   Tobacco Use  . Smoking status: Never Smoker  . Smokeless tobacco: Never Used  Substance Use Topics  . Alcohol use: No    Alcohol/week: 0.0 standard drinks  . Drug use: No    Review of Systems  Constitutional: No fever/chills Eyes: No visual changes. ENT: No sore throat. Cardiovascular: Positive for palpitations and chest tightness. Respiratory: Positive for shortness of breath. Gastrointestinal: No abdominal pain.  No nausea, no vomiting.  No diarrhea.  No constipation. Genitourinary: Negative for dysuria. Musculoskeletal: Negative for back pain. Skin: Negative for rash. Neurological: Positive for dizziness.  Negative for headaches, focal weakness or numbness.   ____________________________________________   PHYSICAL EXAM:  VITAL SIGNS: ED Triage Vitals [06/29/19 2103]  Enc Vitals Group     BP 114/79     Pulse Rate 88     Resp 20     Temp 99.1 F (37.3 C)     Temp Source Oral     SpO2 100 %     Weight  106 lb (48.1 kg)     Height 5' 6"  (1.676 m)     Head Circumference      Peak Flow      Pain Score 4     Pain Loc      Pain Edu?      Excl. in [place]?     Constitutional: Alert and oriented. Well appearing and in no acute distress. Eyes: Conjunctivae are normal. PERRL. EOMI. Head: Atraumatic. Nose: No congestion/rhinnorhea. Mouth/Throat: Mucous membranes are moist.  Oropharynx non-erythematous. Neck: No stridor.  No goiter. Cardiovascular: Normal rate, regular rhythm. Grossly normal heart sounds.  Good peripheral  circulation. Respiratory: Normal respiratory effort.  No retractions. Lungs CTAB. Gastrointestinal: Soft and nontender. No distention. No abdominal bruits. No CVA tenderness. Musculoskeletal: No lower extremity tenderness nor edema.  No joint effusions. Neurologic:  Normal speech and language. No gross focal neurologic deficits are appreciated. No gait instability. Skin:  Skin is warm, dry and intact. No rash noted. Psychiatric: Mood and affect are normal. Speech and behavior are normal.  ____________________________________________   LABS (all labs ordered are listed, but only abnormal results are displayed)  Labs Reviewed  BASIC METABOLIC PANEL - Abnormal; Notable for the following components:      Result Value   CO2 21 (*)    All other components within normal limits  CBC - Abnormal; Notable for the following components:   Hemoglobin 11.7 (*)    HCT 35.9 (*)    All other components within normal limits  T4, FREE - Abnormal; Notable for the following components:   Free T4 1.16 (*)    All other components within normal limits  NOVEL CORONAVIRUS, NAA (HOSP ORDER, SEND-OUT TO REF LAB; TAT 18-24 HRS)  TSH  POC URINE PREG, ED  POCT PREGNANCY, URINE  TROPONIN I (HIGH SENSITIVITY)   ____________________________________________  EKG  ED ECG REPORT I, Hiral Lukasiewicz J, the attending physician, personally viewed and interpreted this ECG.   Date: 06/30/2019  EKG Time: 2113  Rate: 89  Rhythm: normal EKG, normal sinus rhythm  Axis: Normal  Intervals:none  ST&T Change: Nonspecific  ____________________________________________  RADIOLOGY  ED MD interpretation: No acute cardiopulmonary process; no PE  Official radiology report(s): DG Chest 2 View  Result Date: 06/29/2019 CLINICAL DATA:  Tachycardia EXAM: CHEST - 2 VIEW COMPARISON:  03/30/2015 FINDINGS: The heart size and mediastinal contours are within normal limits. Both lungs are clear. The visualized skeletal structures are  unremarkable. IMPRESSION: No acute abnormality of the lungs. Electronically Signed   By: Eddie Candle M.D.   On: 06/29/2019 21:22   CT Angio Chest PE W/Cm &/Or Wo Cm  Result Date: 06/30/2019 CLINICAL DATA:  Cardiac palpitations with shortness of breath EXAM: CT ANGIOGRAPHY CHEST WITH CONTRAST TECHNIQUE: Multidetector CT imaging of the chest was performed using the standard protocol during bolus administration of intravenous contrast. Multiplanar CT image reconstructions and MIPs were obtained to evaluate the vascular anatomy. CONTRAST:  80m OMNIPAQUE IOHEXOL 350 MG/ML SOLN COMPARISON:  Chest x-ray from the previous day FINDINGS: Cardiovascular: Thoracic aorta demonstrates a variant anatomy with aberrant right subclavian artery. No aneurysmal dilatation or dissection is seen. No cardiac enlargement is noted. No pericardial effusion is seen. No coronary calcifications are noted. The pulmonary artery shows a normal branching pattern. No intraluminal filling defect to suggest pulmonary embolism is seen. Mediastinum/Nodes: The thoracic inlet is within normal limits. No hilar or mediastinal adenopathy is seen. The esophagus as visualized is within normal limits. Lungs/Pleura: The lungs are well  aerated bilaterally. No focal infiltrate or sizable effusion is seen. No pneumothorax is noted. Upper Abdomen: Visualized upper abdomen shows no acute abnormality. Musculoskeletal: No chest wall abnormality. No acute or significant osseous findings. Review of the MIP images confirms the above findings. IMPRESSION: No evidence of pulmonary emboli. No acute abnormality noted. Electronically Signed   By: Inez Catalina M.D.   On: 06/30/2019 01:06    ____________________________________________   PROCEDURES  Procedure(s) performed (including Critical Care):  Procedures   ____________________________________________   INITIAL IMPRESSION / ASSESSMENT AND PLAN / ED COURSE  As part of my medical decision making, I  reviewed the following data within the [place] notes reviewed and incorporated, Labs reviewed, EKG interpreted, Old chart reviewed, Radiograph reviewed and Notes from prior ED visits     Sheila Camacho was evaluated in Emergency Department on 06/30/2019 for the symptoms described in the history of present illness. She was evaluated in the context of the global COVID-19 pandemic, which necessitated consideration that the patient might be at risk for infection with the SARS-CoV-2 virus that causes COVID-19. Institutional protocols and algorithms that pertain to the evaluation of patients at risk for COVID-19 are in a state of rapid change based on information released by regulatory bodies including the CDC and federal and state organizations. These policies and algorithms were followed during the patient's care in the ED.    23 year old female who presents with palpitations, chest tightness, shortness of breath and dizziness while at work [employer]. Differential diagnosis includes, but is not limited to, ACS, aortic dissection, pulmonary embolism, cardiac tamponade, pneumothorax, pneumonia, pericarditis, myocarditis, GI-related causes including esophagitis/gastritis, and musculoskeletal chest wall pain.    Initial work-up is unremarkable.  Will check thyroid panel, COVID-19 swab.  Obtain CTA chest to evaluate for PE.   Clinical Course as of Jun 29 237  Sheila Jun 30, 2019  0238 Updated patient on CT results.  Will refer to cardiology for outpatient follow-up.  Strict return precautions given.  Patient verbalizes understanding and agrees with plan of care.   [JS]    Clinical Course User Index [JS] Paulette Blanch, MD     ____________________________________________   FINAL CLINICAL IMPRESSION(S) / ED DIAGNOSES  Final diagnoses:  Heart palpitations     ED Discharge Orders    None       Note:  This document was prepared using Dragon voice recognition  software and may include unintentional dictation errors.   Paulette Blanch, MD 06/30/19 [phone]

## 2019-06-30 NOTE — Discharge Instructions (Signed)
1.  Decrease caffeine intake as much as possible. 2.  Return to the ER for return or worsening symptoms, persistent vomiting, difficulty breathing or other concerns.

## 2019-07-01 LAB — NOVEL CORONAVIRUS, NAA (HOSP ORDER, SEND-OUT TO REF LAB; TAT 18-24 HRS): SARS-CoV-2, NAA: NOT DETECTED

## 2019-07-04 ENCOUNTER — Encounter: Payer: Self-pay | Admitting: Family

## 2019-07-04 ENCOUNTER — Other Ambulatory Visit: Payer: Self-pay

## 2019-07-04 ENCOUNTER — Ambulatory Visit (INDEPENDENT_AMBULATORY_CARE_PROVIDER_SITE_OTHER): Payer: BC Managed Care – PPO | Admitting: Family

## 2019-07-04 VITALS — BP 114/73 | HR 78 | Ht 65.0 in | Wt 120.5 lb

## 2019-07-04 DIAGNOSIS — D821 Di George's syndrome: Secondary | ICD-10-CM | POA: Diagnosis not present

## 2019-07-04 DIAGNOSIS — F419 Anxiety disorder, unspecified: Secondary | ICD-10-CM | POA: Diagnosis not present

## 2019-07-04 DIAGNOSIS — I341 Nonrheumatic mitral (valve) prolapse: Secondary | ICD-10-CM

## 2019-07-04 DIAGNOSIS — R002 Palpitations: Secondary | ICD-10-CM | POA: Diagnosis not present

## 2019-07-04 NOTE — Patient Instructions (Addendum)
Medication Instructions:  - Your physician recommends that you continue on your current medications as directed. Please refer to the Current Medication list given to you today.  *If you need a refill on your cardiac medications before your next appointment, please call your pharmacy*  Lab Work: - none ordered  If you have labs (blood work) drawn today and your tests are completely normal, you will receive your results only by: Marland Kitchen MyChart Message (if you have MyChart) OR . A paper copy in the mail If you have any lab test that is abnormal or we need to change your treatment, we will call you to review the results.  Testing/Procedures: - Your physician has requested that you have an echocardiogram. Echocardiography is a painless test that uses sound waves to create images of your heart. It provides your doctor with information about the size and shape of your heart and how well your heart's chambers and valves are working. This procedure takes approximately one hour. There are no restrictions for this procedure.  Follow-Up: At [facility], you and your health needs are our priority.  As part of our continuing mission to provide you with exceptional heart care, we have created designated Provider Care Teams.  These Care Teams include your primary Cardiologist (physician) and Advanced Practice Providers (APPs -  Physician Assistants and Nurse Practitioners) who all work together to provide you with the care you need, when you need it.  Your next appointment:   2 month(s)  The format for your next appointment:   In Person  Provider:    You may see Debbe Odea, MD or one of the following Advanced Practice Providers on your designated Care Team:    Nicolasa Ducking, NP  Eula Listen, PA-C  Marisue Ivan, PA-C   Other Instructions   Medications and palpitations: 1. Avoid all over-the-counter antihistamines except Claritin/Loratadine and Zyrtec/Cetrizine. 2. Avoid all  combination including cold sinus allergies flu decongestant and sleep medications 3. You can use Robitussin DM Mucinex and Mucinex DM for cough. 4. can use Tylenol aspirin ibuprofen and naproxen but no combinations such as sleep or sinus.  Palpitations Palpitations are feelings that your heartbeat is not normal. Your heartbeat may feel like it is:  Uneven.  Faster than normal.  Fluttering.  Skipping a beat. This is usually not a serious problem. In some cases, you may need tests to rule out any serious problems. Follow these instructions at home: Pay attention to any changes in your condition. Take these actions to help manage your symptoms: Eating and drinking  Avoid: ? Coffee, tea, soft drinks, and energy drinks. ? Chocolate. ? Alcohol. ? Diet pills. Lifestyle   Try to lower your stress. These things can help you relax: ? Yoga. ? Deep breathing and meditation. ? Exercise. ? Using words and images to create positive thoughts (guided imagery). ? Using your mind to control things in your body (biofeedback).  Do not use drugs.  Get plenty of rest and sleep. Keep a regular bed time. General instructions   Take over-the-counter and prescription medicines only as told by your doctor.  Do not use any products that contain nicotine or tobacco, such as cigarettes and e-cigarettes. If you need help quitting, ask your doctor.  Keep all follow-up visits as told by your doctor. This is important. You may need more tests if palpitations do not go away or get worse. Contact a doctor if:  Your symptoms last more than 24 hours.  Your symptoms occur more  often. Get help right away if you:  Have chest pain.  Feel short of breath.  Have a very bad headache.  Feel dizzy.  Pass out (faint). Summary  Palpitations are feelings that your heartbeat is uneven or faster than normal. It may feel like your heart is fluttering or skipping a beat.  Avoid food and drinks that may  cause palpitations. These include caffeine, chocolate, and alcohol.  Try to lower your stress. Do not smoke or use drugs.  Get help right away if you faint or have chest pain, shortness of breath, a severe headache, or dizziness. This information is not intended to replace advice given to you by your health care provider. Make sure you discuss any questions you have with your health care provider. Document Released: 04/12/2008 Document Revised: 08/16/2017 Document Reviewed: 08/16/2017 Elsevier Patient Education  2020 [employer].   Mitral Valve Prolapse  Mitral valve prolapse is a heart condition involving the mitral valve. This is the valve between the upper chamber (atrium) and the lower chamber (ventricle) on the left side of the heart. Normally, the mitral valve allows blood to flow from the atrium to the ventricle and then seals off the chambers from one another. If you have mitral valve prolapse, the valve does not work the way that it should. The flaps of the mitral valve do not form a tight seal between the atrium and the ventricle. When this happens, blood can flow the wrong way (mitral valve regurgitation). This causes symptoms of mitral valve prolapse. This condition may develop if:  The valve opening stretches abnormally.  The mitral valve flaps are larger and thicker than normal.  The valve flaps flop or bulge more than they should.  One or more of the string-like connections between the valve flaps and the left ventricular muscle (chordae tendineae) become abnormally elongated or rupture. What are the causes? The exact cause of this condition is not known. In some cases, it may be passed down (inherited) from a family member who also had the condition. It can also be a complication of other diseases. What increases the risk? You are more likely to develop this condition if you have:  A family history of mitral valve prolapse.  An infection in the lining or valves of the  heart (endocarditis).  Muscular dystrophy.  Graves' disease.  Scoliosis.  A connective tissue disorder. What are the signs or symptoms? Symptoms of this condition include:  A fast or irregular heartbeat (palpitations).  Chest pain.  Dizziness.  Shortness of breath.  Anxiety.  Fatigue. In some cases, there are no symptoms for this condition. How is this diagnosed? This condition may be diagnosed based on:  Your symptoms and medical history.  A physical exam, which includes listening to your heart with a stethoscope for the presence of a murmur or a sound like a click.  Imaging studies of your heart, such as: ? X-rays. These check for fluid in the lungs. ? A test that uses sound waves to show the size of your heart and how well it pumps (echocardiogram). ? A test that uses sound waves to take pictures of the blood flow through your valve (Doppler ultrasound). ? A test that records the electrical activity of your heart (electrocardiogram, or ECG). ? A test that looks at the structure and function of the heart (cardiac catheterization). ? An MRI. How is this treated? Treatment for this condition depends on the cause and how severe your symptoms are. Treatment may include:  Medicines. You may need to take: ? Beta blockers. These help with chest discomfort and palpitations. ? Antibiotics to treat endocarditis. ? Vasodilators. These improve forward blood flow through the valve, if it is leaking. ? Water pills (diuretics). These get rid of any extra fluid that is present. ? Blood thinners. Some people with mitral valve prolapse develop a type of irregular or rapid heartbeat (atrial fibrillation) and need to take blood thinners to prevent stroke.  Surgery to repair or replace the mitral valve.  A procedure where a catheter-based device is used to repair the mitral valve. If you do not have symptoms, you may not need treatment. Many people only need regular follow-up visits  with a heart specialist (cardiologist). Follow these instructions at home: Lifestyle  Maintain a healthy weight.  Regular exercise is important for the health of your heart and for maintaining a healthy weight. Ask your health care provider what type of exercise is safe for you.  Do not use any products that contain nicotine or tobacco, such as cigarettes, e-cigarettes, and chewing tobacco. If you need help quitting, ask your health care provider. Eating and drinking   Eat a heart-healthy diet that includes whole grains, fruits and vegetables, low-fat (lean) proteins, and low-fat or nonfat dairy products.  Do not drink alcohol if: ? Your health care provider tells you not to drink. ? You are pregnant, may be pregnant, or are planning to become pregnant.  If you drink alcohol: ? Limit how much you use to:  0-1 drink a day for women.  0-2 drinks a day for men. ? Be aware of how much alcohol is in your drink. In the [place] one drink equals one 12 oz bottle of beer (355 mL), one 5 oz glass of wine (148 mL), or one 1 oz glass of hard liquor (44 mL). Oral health  Brush and floss your teeth every day. See a dentist regularly. Having unhealthy teeth and gums may make endocarditis more likely.  Tell your dentist if you have a mitral valve prolapse due to a history of endocarditis. You may need to take antibiotics to prevent a valve infection before you have dental procedures. General instructions  Take over-the-counter and prescription medicines only as told by your health care provider.  Keep all follow-up visits as told by your health care provider. This is important. Contact a health care provider if:  You have palpitations.  You are often very tired.  You have a cough that will not go away.  Your symptoms of mitral valve prolapse begin to get worse. Get help right away if you:  Have chest pain or shortness of breath.  Faint.  Start to have chills, body aches, and a  fever. These symptoms may represent a serious problem that is an emergency. Do not wait to see if the symptoms will go away. Get medical help right away. Call your local emergency services (911 in the [place] Do not drive yourself to the hospital. Summary  Mitral valve prolapse is a heart condition involving the mitral valve. This is the valve between the upper chamber (atrium) and the lower chamber (ventricle) on the left side of the heart.  If you do not have symptoms, you may not need treatment. Many people only need regular follow-up visits with a heart specialist (cardiologist).  Tell your dentist if you have a mitral valve prolapse due to a history of endocarditis. You may need to take antibiotics to prevent a valve infection before you have  dental procedures. This information is not intended to replace advice given to you by your health care provider. Make sure you discuss any questions you have with your health care provider. Document Released: 07/01/2000 Document Revised: 06/17/2018 Document Reviewed: 06/17/2018 Elsevier Patient Education  2020 [employer]   Echocardiogram An echocardiogram is a procedure that uses painless sound waves (ultrasound) to produce an image of the heart. Images from an echocardiogram can provide important information about:  Signs of coronary artery disease (CAD).  Aneurysm detection. An aneurysm is a weak or damaged part of an artery wall that bulges out from the normal force of blood pumping through the body.  Heart size and shape. Changes in the size or shape of the heart can be associated with certain conditions, including heart failure, aneurysm, and CAD.  Heart muscle function.  Heart valve function.  Signs of a past heart attack.  Fluid buildup around the heart.  Thickening of the heart muscle.  A tumor or infectious growth around the heart valves. Tell a health care provider about:  Any allergies you have.  All medicines you are  taking, including vitamins, herbs, eye drops, creams, and over-the-counter medicines.  Any blood disorders you have.  Any surgeries you have had.  Any medical conditions you have.  Whether you are pregnant or may be pregnant. What are the risks? Generally, this is a safe procedure. However, problems may occur, including:  Allergic reaction to dye (contrast) that may be used during the procedure. What happens before the procedure? No specific preparation is needed. You may eat and drink normally. What happens during the procedure?   An IV tube may be inserted into one of your veins.  You may receive contrast through this tube. A contrast is an injection that improves the quality of the pictures from your heart.  A gel will be applied to your chest.  A wand-like tool (transducer) will be moved over your chest. The gel will help to transmit the sound waves from the transducer.  The sound waves will harmlessly bounce off of your heart to allow the heart images to be captured in real-time motion. The images will be recorded on a computer. The procedure may vary among health care providers and hospitals. What happens after the procedure?  You may return to your normal, everyday life, including diet, activities, and medicines, unless your health care provider tells you not to do that. Summary  An echocardiogram is a procedure that uses painless sound waves (ultrasound) to produce an image of the heart.  Images from an echocardiogram can provide important information about the size and shape of your heart, heart muscle function, heart valve function, and fluid buildup around your heart.  You do not need to do anything to prepare before this procedure. You may eat and drink normally.  After the echocardiogram is completed, you may return to your normal, everyday life, unless your health care provider tells you not to do that. This information is not intended to replace advice given to  you by your health care provider. Make sure you discuss any questions you have with your health care provider. Document Released: 07/01/2000 Document Revised: 10/25/2018 Document Reviewed: 08/06/2016 Elsevier Patient Education  2020 [employer]

## 2019-07-04 NOTE — Progress Notes (Signed)
Office Visit    Patient Name: Sheila Camacho Date of Encounter: 07/04/2019  Primary Care Provider:  [facility] Primary Cardiologist:  No primary care provider on file. Electrophysiologist:  None   Chief Complaint    Sheila Camacho is a 23 y.o. female with a hx of mitral valve prolapse, palpitations, bipolar disorder, PTSD, anxiety presents today for follow-up after ED visit for palpitations  Past Medical History    Past Medical History:  Diagnosis Date  . 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 Prolonged QT interval    Past Surgical History:  Procedure Laterality Date  . HERNIA REPAIR  2005    Allergies  No Known Allergies  History of Present Illness    Sheila Camacho is a 23 y.o. 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 Alvino ChapelIngal.  She presents today after ED visit for palpitations.  Previously evaluated by cardiology at [facility].  Last seen by them 2013.  Noted history of syncope.  EKG 2010 showed borderline prolonged QT.  Underwent extensive work-up to rule out long QT syndrome.  Normal echo 05/27/2009.  Essentially normal Holter monitor from same day.  Epinephrine infusion study 05/28/2009 which was negative.  Exercise stress test 06/17/2009 within normal limits.  Her cardiac testing was felt to rule out prolonged QT syndrome.  Syncope was attributed to vasovagal syncope.  Seen in 2018 by Dr. Alvino ChapelIngal for follow-up of prolonged QT syndrome.  She was recommended for echocardiogram.  Echo 12/15/2016 with LVEF 55 to 60%, mild MV prolapse.  She was recommended for follow-up echo in 1 year which was not completed.  She presented to the ED 06/30/2019 with complaints of palpitations, dizziness, chest tightness, shortness of breath.  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  She had a CT angio which noted a thoracic aorta with variant anatomy with aberrant right subclavian artery, no PE noted.  Troponin was negative TSH was normal, T4 mildly elevated.  Normal electrolytes, kidney function.  Covid testing negative.  Tells me she is "addicted to Coke".  Previously drank up to 8 cans/day but now is down to 2 cans/day.  She does not smoke nor drink alcohol.  Takes no over-the-counter nor prescribed proarrhythmic medications.  She does endorse recent stress at work as some of her coworkers "stress".  Tells me she has not had recurrent episodes of palpitations since leaving the ED.  Tells me that she thinks what happened is that she had a "anxiety attack "as multiple of her coworkers were stressing her out.  Tells me she only notices shortness of breath, chest tightness when she has episodes of anxiety associated with her bipolar disorder and PTSD.  Tells me she had forgotten to take her medications that day of the event.  She has not had recurrent chest pain, pressure, tightness since leaving the ED.  She reports no lightheadedness, dizziness, near-syncope, syncope.  EKGs/Labs/Other Studies Reviewed:   The following studies were reviewed today: CT Angio Chest   FINDINGS: Cardiovascular: Thoracic aorta demonstrates a variant anatomy with aberrant right subclavian artery. No aneurysmal dilatation or dissection is seen. No cardiac enlargement is noted. No pericardial effusion is seen. No coronary calcifications are noted. The pulmonary artery shows a normal branching pattern. No intraluminal filling defect to  suggest pulmonary embolism is seen.   Mediastinum/Nodes: The thoracic inlet is within normal limits. No hilar or mediastinal adenopathy is seen. The esophagus as visualized is within normal limits.   Lungs/Pleura: The lungs are well aerated bilaterally. No focal infiltrate or sizable effusion is seen. No pneumothorax is noted.    Upper Abdomen: Visualized upper abdomen shows no acute abnormality.   Musculoskeletal: No chest wall abnormality. No acute or significant osseous findings.   Review of the MIP images confirms the above findings.   IMPRESSION: No evidence of pulmonary emboli.   No acute abnormality noted.  Echocardiogram 12/15/2016 Study Conclusions   - Left ventricle: The cavity size was normal. Wall thickness was   normal. Systolic function was normal. The estimated ejection   fraction was in the range of 55% to 60%. Wall motion was normal;   there were no regional wall motion abnormalities. Left   ventricular diastolic function parameters were normal. - Mitral valve: Mild thickening. Mild prolapse. There was mild   regurgitation. - Right ventricle: The cavity size was normal. Wall thickness was   normal. Systolic function was normal.   EKG:  EKG is ordered today.  The ekg ordered today demonstrates sinus rhythm rate 70 bpm with nonspecific T wave changes.  Recent Labs: 06/29/2019: BUN 8; Creatinine, Ser 0.70; Hemoglobin 11.7; Platelets 217; Potassium 3.6; Sodium 138; TSH 0.824  Recent Lipid Panel    Component Value Date/Time   CHOL (H) 09/10/2010 0655    185        ATP III CLASSIFICATION:  <200     mg/dL   Desirable  200-239  mg/dL   Borderline High  >=240    mg/dL   High          TRIG 85 09/10/2010 0655   HDL 56 09/10/2010 0655   CHOLHDL 3.3 09/10/2010 0655   VLDL 17 09/10/2010 0655   LDLCALC (H) 09/10/2010 0655    112        Total Cholesterol/HDL:CHD Risk Coronary Heart Disease Risk Table                     Men   Women  1/2 Average Risk   3.4   3.3  Average Risk       5.0   4.4  2 X Average Risk   9.6   7.1  3 X Average Risk  23.4   11.0        Use the calculated Patient Ratio above and the CHD Risk Table to determine the patient's CHD Risk.        ATP III CLASSIFICATION (LDL):  <100     mg/dL   Optimal  100-129  mg/dL   Near or Above                    Optimal   130-159  mg/dL   Borderline  160-189  mg/dL   High  >190     mg/dL   Very High    Home Medications   Current Meds  Medication Sig  . FLUoxetine (PROZAC) 10 MG capsule Take 10 mg by mouth daily.  Marland Kitchen ketoconazole (NIZORAL) 2 % shampoo WASH HAIR WITH SHAMPOO DAILY  . lamoTRIgine (LAMICTAL) 25 MG tablet Take 50 mg by mouth 2 (two) times daily.      Review of Systems    Review of Systems  Constitution: Negative for chills, fever and malaise/fatigue.  Cardiovascular: Negative for chest pain, dyspnea on  exertion, irregular heartbeat, leg swelling, near-syncope and palpitations.  Respiratory: Negative for cough, shortness of breath and wheezing.   Gastrointestinal: Negative for nausea and vomiting.  Neurological: Negative for dizziness, light-headedness and weakness.  Psychological: Positive for anxiety.   All other systems reviewed and are otherwise negative except as noted above.  Physical Exam    VS:  BP 114/73 (BP Location: Right Arm, Patient Position: Sitting, Cuff Size: Normal)   Pulse 78   Ht 5\' 5"  (1.651 m)   Wt 120 lb 8 oz (54.7 kg)   SpO2 99%   BMI 20.05 kg/m  , BMI Body mass index is 20.05 kg/m. GEN: Well nourished, well developed, in no acute distress. HEENT: normal. Neck: Supple, no JVD, carotid bruits, or masses. Cardiac: RRR, no murmurs, rubs, or gallops. No clubbing, cyanosis, edema.  Radials/DP/PT 2+ and equal bilaterally.  Respiratory:  Respirations regular and unlabored, clear to auscultation bilaterally. GI: Soft, nontender, nondistended, BS + x 4. MS: No deformity or atrophy. Skin: Warm and dry, no rash. Neuro:  Strength and sensation are intact. Psych: Normal affect.  Assessment & Plan    1. Palpitations -no recurrent episodes since ED visit 06/30/2019.  She does endorse she will have palpitations if she drinks too much caffeine, caffeine cessation encouraged.  She does not smoke nor drink alcohol.  She had a normal TSH with mildly elevated T4.  Normal  electrolytes.  We discussed the possibility of a ZIO monitor and she tells me she prefers to proceed with echocardiogram first as her palpitations are really not bothersome.  Etiology of palpitations is likely stress, anxiety -on date of ED visit she notes that she forgot to take her medications for bipolar/PTSD.  2. Chest pressure -no recurrence since ED visit.  No indication for ischemic evaluation at this time.  EKG today sinus rhythm with nonspecific T wave changes.  3. Dizziness -no recurrence since ED visit.  Encouraged her to remain adequately hydrated.  4. Mitral valve prolapse -mild mitral valve prolapse noted on echo in 2018.  Recommended for 1 year follow-up which is not completed. Echocardiogram ordered today.  Worsening mitral valve prolapse could be part of the etiology of her palpitations.  5. Digeorge's Syndrome -noted history.  Concern for congenital heart defects.  Echocardiogram as above.  Echocardiogram for re-evaluation of mild MV prolapse. If MV prolapse has worsened may require treatment. Palpitations have not recurred, emphasized importance of taking her medications regularly. She will call the office if palpitations recur and we can consider ZIO.   Disposition: Follow up in 2 month(s)    2019, NP 07/04/2019, 4:49 PM

## 2019-07-10 DIAGNOSIS — Z3042 Encounter for surveillance of injectable contraceptive: Secondary | ICD-10-CM | POA: Diagnosis not present

## 2019-08-06 ENCOUNTER — Other Ambulatory Visit: Payer: Self-pay

## 2019-08-06 ENCOUNTER — Ambulatory Visit (INDEPENDENT_AMBULATORY_CARE_PROVIDER_SITE_OTHER): Payer: BC Managed Care – PPO

## 2019-08-06 DIAGNOSIS — I341 Nonrheumatic mitral (valve) prolapse: Secondary | ICD-10-CM | POA: Diagnosis not present

## 2019-08-08 ENCOUNTER — Telehealth: Payer: Self-pay

## 2019-08-08 NOTE — Telephone Encounter (Signed)
Called and spoke with the pt regarding her echocardiogram results as noted by Beecher Mcardle.  Pt verbalized understanding and did not have any additional questions.

## 2019-08-08 NOTE — Telephone Encounter (Signed)
-----   Message from Alver Sorrow, NP sent at 08/07/2019  4:52 PM EST ----- Pumping function low normal 50-55%. Mild mitral valve regurgitation and mild mitral valve prolapse are stable compared to echocardiogram 2018. Continue current plan as discussed in office visit.

## 2019-09-03 NOTE — Progress Notes (Deleted)
Cardiology Office Note    Date:  09/03/2019   ID:  Sheila Camacho, DOB [birth year], MRN [id number]  PCP:  Wayland Denis, PA-C  Cardiologist:  Formerly Dr. Yvone Neu, MD  Electrophysiologist:  None   Chief Complaint: Follow up  History of Present Illness:   Sheila Camacho is a 24 y.o. 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  She was previously evaluated by [facility] pediatric cardiology in the setting of syncope with prior EKG showing borderline prolonged QT.  She underwent extensive cardiac work-up to evaluate this including echo in 05/2009 which was normal, essentially normal Holter monitor from the same day, negative epinephrine infusion study in 05/2009, and exercise stress test in 06/2009 normal.  Ultimately, it was felt her cardiac testing ruled out prolonged QT syndrome and her syncope was attributed to vasovagal etiology.  She was seen by Dr. Yvone Neu in 2018 for follow-up of possible prolonged QT with EKG at that time showing QTc of 446 ms.  Echo in 2018 showed an EF of 55 to 60%, normal wall motion, normal diastolic function, mild mitral valve prolapse with mild regurgitation, normal RV systolic function and cavity size.  No further testing was recommended.  More recently, she was seen in the ED in 06/2019 with palpitations.  It was noted she was previously drinking 8 sodas per day and had cut them down to 3/day.  She denied any energy drinks, herbal medications, or illicit substances.  Labs showed a potassium of 3.6, normal renal function, hemoglobin 11.7 with baseline around 12, TSH normal, high-sensitivity troponin less than 2, negative hCG.  Chest x-ray showed no acute abnormality.  CTA chest was negative for PE with no acute abnormality noted, including no coronary artery calcifications.  EKG  showed sinus rhythm with no acute ST-T changes with QTc of 446 ms.  She was seen by Laurann Montana, NP in follow-up on 07/04/2019 and was down to 2 cans of soda per day.  She did note increased stress at work.  She had not had any further recurrent episodes of palpitations since her ED evaluation.  Patient wondered if it was an "anxiety attack."  EKG shows sinus rhythm, 70 bpm, nonspecific ST-T changes, QTc 437 ms.  It was recommended she abstain from caffeine.  Echo 08/06/2019 showed a low normal EF of 50 to 55%, no regional wall motion abnormalities, low normal RV SF with normal cavity size, mild MVP with mild MR, normal size and structure aortic root.  ***   Labs independently reviewed: 07/2019 - HGB 12, PLT 155, COVID 19 positive 06/2019 - TSH normal, potassium 3.6, BUN 8, SCr 0.70 04/2018 - AST/ALT normal, albumin 4.6 12/2017 - TC 178, TG 114, HDL 49, LDL 106  Past Medical History:  Diagnosis Date  . 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 Tama Gander syndrome [facility])   . Prolonged QT interval     Past Surgical History:  Procedure Laterality Date  . HERNIA REPAIR  2005    Current Medications: No outpatient medications have been marked as taking for the 09/10/19 encounter (Appointment) with Rise Mu, PA-C.    Allergies:   Patient has no known allergies.   Social History   Socioeconomic History  . Marital status: Single    Spouse name: Not on file  .  Number of children: Not on file  . Years of education: Not on file  . Highest education level: Not on file  Occupational History  . Not on file  Tobacco Use  . Smoking status: Former Games developer  . Smokeless tobacco: Never Used  Substance and Sexual Activity  . Alcohol use: No    Alcohol/week: 0.0 standard drinks  . Drug use: No  . Sexual activity: Never  Other Topics Concern  . Not on file  Social History Narrative   Lives with Mom and  boyfriend.   Sees Dad every other weekend and twice a week.   Immunizations not currently up to date.   Cigarette smoke outside of house.   [employer]   Some learning issues at school, has tutors   Social Determinants of Corporate investment banker Strain:   . Difficulty of Paying Living Expenses: Not on file  Food Insecurity:   . Worried About Programme researcher, broadcasting/film/video in the Last Year: Not on file  . Ran Out of Food in the Last Year: Not on file  Transportation Needs:   . Lack of Transportation (Medical): Not on file  . Lack of Transportation (Non-Medical): Not on file  Physical Activity:   . Days of Exercise per Week: Not on file  . Minutes of Exercise per Session: Not on file  Stress:   . Feeling of Stress : Not on file  Social Connections:   . Frequency of Communication with Friends and Family: Not on file  . Frequency of Social Gatherings with Friends and Family: Not on file  . Attends Religious Services: Not on file  . Active Member of Clubs or Organizations: Not on file  . Attends Banker Meetings: Not on file  . Marital Status: Not on file     Family History:  The patient's family history includes Asthma in her maternal grandmother; Mitral valve prolapse in her mother. There is no history of Cancer.  ROS:   ROS   EKGs/Labs/Other Studies Reviewed:    Studies reviewed were summarized above. The additional studies were reviewed today:  2D Echo 08/06/2019: 1. Left ventricular ejection fraction, by visual estimation, is 50 to  55%. The left ventricle has low normal function. There is no left  ventricular hypertrophy.  2. The left ventricle has no regional wall motion abnormalities.  3. Global right ventricle has low normal systolic function.The right  ventricular size is normal. No increase in right ventricular wall  thickness.  4. Left atrial size was normal.  5. Right atrial size was normal.  6. Mild mitral valve prolapse.  7. The  mitral valve is myxomatous. Mild mitral valve regurgitation.  8. The tricuspid valve is grossly normal.  9. The tricuspid valve is grossly normal. Tricuspid valve regurgitation  is trivial.  10. The aortic valve has an indeterminant number of cusps. Aortic valve  regurgitation is not visualized. No evidence of aortic valve sclerosis or  stenosis.  11. The pulmonic valve was normal in structure. Pulmonic valve  regurgitation is trivial.  12. TR signal is inadequate for assessing pulmonary artery systolic  pressure.  13. The inferior vena cava is normal in size with greater than 50%  respiratory variability, suggesting right atrial pressure of 3 mmHg.  14. The interatrial septum was not well visualized.    EKG:  EKG is ordered today.  The EKG ordered today demonstrates ***  Recent Labs: 06/29/2019: BUN 8; Creatinine, Ser 0.70; Hemoglobin 11.7; Platelets 217;  Potassium 3.6; Sodium 138; TSH 0.824  Recent Lipid Panel    Component Value Date/Time   CHOL (H) 09/10/2010 0655    185        ATP III CLASSIFICATION:  <200     mg/dL   Desirable  924-268  mg/dL   Borderline High  >=341    mg/dL   High          TRIG 85 09/10/2010 0655   HDL 56 09/10/2010 0655   CHOLHDL 3.3 09/10/2010 0655   VLDL 17 09/10/2010 0655   LDLCALC (H) 09/10/2010 0655    112        Total Cholesterol/HDL:CHD Risk Coronary Heart Disease Risk Table                     Men   Women  1/2 Average Risk   3.4   3.3  Average Risk       5.0   4.4  2 X Average Risk   9.6   7.1  3 X Average Risk  23.4   11.0        Use the calculated Patient Ratio above and the CHD Risk Table to determine the patient's CHD Risk.        ATP III CLASSIFICATION (LDL):  <100     mg/dL   Optimal  962-229  mg/dL   Near or Above                    Optimal  130-159  mg/dL   Borderline  798-921  mg/dL   High  >194     mg/dL   Very High    PHYSICAL EXAM:    VS:  There were no vitals taken for this visit.  BMI: There is no height or  weight on file to calculate BMI.  Physical Exam  Wt Readings from Last 3 Encounters:  07/04/19 120 lb 8 oz (54.7 kg)  06/29/19 106 lb (48.1 kg)  11/30/17 102 lb 12 oz (46.6 kg)     ASSESSMENT & PLAN:   1. ***  Disposition: F/u with Dr. Marland Kitchen in ***.   Medication Adjustments/Labs and Tests Ordered: Current medicines are reviewed at length with the patient today.  Concerns regarding medicines are outlined above. Medication changes, Labs and Tests ordered today are summarized above and listed in the Patient Instructions accessible in Encounters.   Signed, Eula Listen, PA-C 09/03/2019 10:50 AM     [facility] [address] [place], [place] [address] [phone]

## 2019-09-09 NOTE — Progress Notes (Deleted)
Cardiology Office Note    Date:  09/09/2019   ID:  Daysie, Helf 1995/10/25, MRN [id number]  PCP:  Carren Rang, PA-C  Cardiologist:  Formerly Dr. Alvino Chapel, MD Electrophysiologist:  None   Chief Complaint: Follow up  History of Present Illness:   Sheila Camacho is a 24 y.o. 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  She was previously evaluated by [facility] pediatric cardiology in the setting of syncope with prior EKG showing borderline prolonged QT.  She underwent extensive cardiac work-up to evaluate this including echo in 05/2009 which was normal, essentially normal Holter monitor from the same day, negative epinephrine infusion study in 05/2009, and exercise stress test in 06/2009 normal.  Ultimately, it was felt her cardiac testing ruled out prolonged QT syndrome and her syncope was attributed to vasovagal etiology.  She was seen by Dr. Alvino Chapel in 2018 for follow-up of possible prolonged QT with EKG at that time showing QTc of 446 ms.  Echo in 2018 showed an EF of 55 to 60%, normal wall motion, normal diastolic function, mild mitral valve prolapse with mild regurgitation, normal RV systolic function and cavity size.  No further testing was recommended.  More recently, she was seen in the ED in 06/2019 with palpitations.  It was noted she was previously drinking 8 sodas per day and had cut them down to 3/day.  She denied any energy drinks, herbal medications, or illicit substances.  Labs showed a potassium of 3.6, normal renal function, hemoglobin 11.7 with baseline around 12, TSH normal, high-sensitivity troponin less than 2, negative hCG.  Chest x-ray showed no acute abnormality.  CTA chest was negative for PE with no acute abnormality noted, including no coronary artery calcifications.  EKG  showed sinus rhythm with no acute ST-T changes with QTc of 446 ms.  She was seen by Gillian Shields, NP in follow-up on 07/04/2019 and was down to 2 cans of soda per day.  She did note increased stress at work.  She had not had any further recurrent episodes of palpitations since her ED evaluation.  Patient wondered if it was an "anxiety attack."  EKG shows sinus rhythm, 70 bpm, nonspecific ST-T changes, QTc 437 ms.  It was recommended she abstain from caffeine.  Echo 08/06/2019 showed a low normal EF of 50 to 55%, no regional wall motion abnormalities, low normal RV SF with normal cavity size, mild MVP with mild MR, normal size and structure aortic root.  ***   Labs independently reviewed: 07/2019 - HGB 12, PLT 155, COVID 19 positive 06/2019 - TSH normal, potassium 3.6, BUN 8, SCr 0.70 04/2018 - AST/ALT normal, albumin 4.6 12/2017 - TC 178, TG 114, HDL 49, LDL 106   Past Medical History:  Diagnosis Date  . DEVELOPMENTAL DELAY 03/06/2008   Annotation: 22nd chromosome deletion Qualifier: Diagnosis of  By: Ermalene Searing MD, Amy    . 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 Prolonged QT interval     Past Surgical History:  Procedure Laterality Date  . HERNIA REPAIR  2005    Current Medications: No outpatient medications have been marked as taking for the 09/17/19 encounter (Appointment) with Sondra Barges, PA-C.    Allergies:   Patient has no known allergies.   Social History   Socioeconomic History  . Marital status: Single    Spouse name: Not on file  .  Number of children: Not on file  . Years of education: Not on file  . Highest education level: Not on file  Occupational History  . Not on file  Tobacco Use  . Smoking status: Former Games developer  . Smokeless tobacco: Never Used  Substance and Sexual Activity  . Alcohol use: No    Alcohol/week: 0.0 standard drinks  . Drug use: No  . Sexual activity: Never  Other Topics Concern  . Not on file  Social History Narrative   Lives with Mom and  boyfriend.   Sees Dad every other weekend and twice a week.   Immunizations not currently up to date.   Cigarette smoke outside of house.   [employer]   Some learning issues at school, has tutors   Social Determinants of Corporate investment banker Strain:   . Difficulty of Paying Living Expenses: Not on file  Food Insecurity:   . Worried About Programme researcher, broadcasting/film/video in the Last Year: Not on file  . Ran Out of Food in the Last Year: Not on file  Transportation Needs:   . Lack of Transportation (Medical): Not on file  . Lack of Transportation (Non-Medical): Not on file  Physical Activity:   . Days of Exercise per Week: Not on file  . Minutes of Exercise per Session: Not on file  Stress:   . Feeling of Stress : Not on file  Social Connections:   . Frequency of Communication with Friends and Family: Not on file  . Frequency of Social Gatherings with Friends and Family: Not on file  . Attends Religious Services: Not on file  . Active Member of Clubs or Organizations: Not on file  . Attends Banker Meetings: Not on file  . Marital Status: Not on file     Family History:  The patient's family history includes Asthma in her maternal grandmother; Mitral valve prolapse in her mother. There is no history of Cancer.  ROS:   ROS   EKGs/Labs/Other Studies Reviewed:    Studies reviewed were summarized above. The additional studies were reviewed today:  2D Echo 08/06/2019: 1. Left ventricular ejection fraction, by visual estimation, is 50 to  55%. The left ventricle has low normal function. There is no left  ventricular hypertrophy.  2. The left ventricle has no regional wall motion abnormalities.  3. Global right ventricle has low normal systolic function.The right  ventricular size is normal. No increase in right ventricular wall  thickness.  4. Left atrial size was normal.  5. Right atrial size was normal.  6. Mild mitral valve prolapse.  7. The  mitral valve is myxomatous. Mild mitral valve regurgitation.  8. The tricuspid valve is grossly normal.  9. The tricuspid valve is grossly normal. Tricuspid valve regurgitation  is trivial.  10. The aortic valve has an indeterminant number of cusps. Aortic valve  regurgitation is not visualized. No evidence of aortic valve sclerosis or  stenosis.  11. The pulmonic valve was normal in structure. Pulmonic valve  regurgitation is trivial.  12. TR signal is inadequate for assessing pulmonary artery systolic  pressure.  13. The inferior vena cava is normal in size with greater than 50%  respiratory variability, suggesting right atrial pressure of 3 mmHg.  14. The interatrial septum was not well visualized.    EKG:  EKG is ordered today.  The EKG ordered today demonstrates ***  Recent Labs: 06/29/2019: BUN 8; Creatinine, Ser 0.70; Hemoglobin 11.7; Platelets 217;  Potassium 3.6; Sodium 138; TSH 0.824  Recent Lipid Panel    Component Value Date/Time   CHOL (H) 09/10/2010 0655    185        ATP III CLASSIFICATION:  <200     mg/dL   Desirable  200-239  mg/dL   Borderline High  >=240    mg/dL   High          TRIG 85 09/10/2010 0655   HDL 56 09/10/2010 0655   CHOLHDL 3.3 09/10/2010 0655   VLDL 17 09/10/2010 0655   LDLCALC (H) 09/10/2010 0655    112        Total Cholesterol/HDL:CHD Risk Coronary Heart Disease Risk Table                     Men   Women  1/2 Average Risk   3.4   3.3  Average Risk       5.0   4.4  2 X Average Risk   9.6   7.1  3 X Average Risk  23.4   11.0        Use the calculated Patient Ratio above and the CHD Risk Table to determine the patient's CHD Risk.        ATP III CLASSIFICATION (LDL):  <100     mg/dL   Optimal  100-129  mg/dL   Near or Above                    Optimal  130-159  mg/dL   Borderline  160-189  mg/dL   High  >190     mg/dL   Very High    PHYSICAL EXAM:    VS:  There were no vitals taken for this visit.  BMI: There is no height or  weight on file to calculate BMI.  Physical Exam  Wt Readings from Last 3 Encounters:  07/04/19 120 lb 8 oz (54.7 kg)  06/29/19 106 lb (48.1 kg)  11/30/17 102 lb 12 oz (46.6 kg)     ASSESSMENT & PLAN:   1. ***  Disposition: F/u with Dr. Marland Kitchen in ***.   Medication Adjustments/Labs and Tests Ordered: Current medicines are reviewed at length with the patient today.  Concerns regarding medicines are outlined above. Medication changes, Labs and Tests ordered today are summarized above and listed in the Patient Instructions accessible in Encounters.   Signed, Christell Faith, PA-C 09/09/2019 1:35 PM     [address] [place], [place] [address] [phone]

## 2019-09-10 ENCOUNTER — Ambulatory Visit: Payer: BC Managed Care – PPO | Admitting: Physician Assistant

## 2019-09-17 ENCOUNTER — Ambulatory Visit: Payer: BC Managed Care – PPO | Admitting: Physician Assistant

## 2019-09-18 ENCOUNTER — Encounter: Payer: Self-pay | Admitting: Nurse Practitioner

## 2019-09-18 ENCOUNTER — Other Ambulatory Visit: Payer: Self-pay

## 2019-09-18 ENCOUNTER — Ambulatory Visit (INDEPENDENT_AMBULATORY_CARE_PROVIDER_SITE_OTHER): Payer: BC Managed Care – PPO | Admitting: Nurse Practitioner

## 2019-09-18 VITALS — BP 110/80 | HR 82 | Ht 65.0 in | Wt 114.5 lb

## 2019-09-18 DIAGNOSIS — I341 Nonrheumatic mitral (valve) prolapse: Secondary | ICD-10-CM

## 2019-09-18 DIAGNOSIS — R002 Palpitations: Secondary | ICD-10-CM | POA: Diagnosis not present

## 2019-09-18 DIAGNOSIS — I340 Nonrheumatic mitral (valve) insufficiency: Secondary | ICD-10-CM | POA: Diagnosis not present

## 2019-09-18 NOTE — Progress Notes (Signed)
Office Visit    Patient Name: Sheila Camacho Date of Encounter: 09/18/2019  Primary Care Provider:  Carren Rang, PA-C Primary Cardiologist:  Prev seen by A. Alvino Chapel, MD   Chief Complaint    24 year old female with a history of DiGeorge syndrome, mitral valve prolapse with mild mitral regurgitation, palpitations, bipolar disorder, PTSD, anxiety, and prolonged QT, who presents for follow-up related to palpitations.  Past Medical History    Past Medical History:  Diagnosis Date  . Anxiety   . Bipolar disorder (HCC)   . DEVELOPMENTAL DELAY 03/06/2008   Annotation: 22nd chromosome deletion Qualifier: Diagnosis of  By: Ermalene Searing MD, Amy    . 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 Mitral valve prolapse    a. 11/2016 Echo: EF 55-60%, Mild MR w/ mild prolapse; b. 07/2019 Echo: EF 50-55%, no rwma. 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  . Palpitations   . Prolonged QT interval   . PTSD (post-traumatic stress disorder)    Past Surgical History:  Procedure Laterality Date  . HERNIA REPAIR  2005    Allergies  No Known Allergies  History of Present Illness    24 year old female with the above past medical history including DiGeorge syndrome, mitral valve prolapse with mild mitral vegetation, palpitations, bipolar disorder, PTSD, anxiety, and prolonged QT.  Prolonged QT was previous evaluated at [facility] in the setting of syncope, beginning in 2010 with normal echocardiography at that time.  She also underwent epinephrine infusion study and exercise stress testing in late 2010.  Ultimately, it was felt that prolonged QT syndrome was ruled out.  She subsequently establish care with Dr. Alvino Chapel in 2018.  Echocardiogram showed normal LV function with mild mitral valve prolapse.  Recommendation made at that time for a follow-up echo in 1 year.  In December 2020, she reported to the emergency department with palpitations, dizziness, chest tightness, and dyspnea.  CT angio of the chest was performed and was negative  for PE though she was noted to have thoracic aorta with a variant anatomy and aberrant right subclavian artery.  She was discharged home and advised to follow-up with cardiology.  She was seen in mid December and reported pretty heavy caffeine usage in the setting of [employer] (up to 8 cans/day previously).  She is not having any recurrent palpitations at that time.  Follow-up echocardiography was performed and showed an EF of 50-55% with myxomatous mitral valve and mild MR.  Since her last visit, she has done well.  She has not been experiencing palpitations.  She now feels that palpitations were associated with anxiety and panic attacks which were occurring secondary to a coworker who has since been fired.  She denies chest pain, dyspnea, PND, orthopnea, dizziness, syncope, edema, or early satiety.  Home Medications    Prior to Admission medications   Medication Sig Start Date End Date Taking? Authorizing Provider  FLUoxetine (PROZAC) 10 MG capsule Take 10 mg by mouth daily.    [provider]  ketoconazole (NIZORAL) 2 % shampoo WASH HAIR WITH SHAMPOO DAILY 11/06/17   Bedsole, Amy E, MD  lamoTRIgine (LAMICTAL) 25 MG tablet Take 50 mg by mouth 2 (two) times daily.    [provider]    Review of Systems    She denies chest pain, palpitations, dyspnea, pnd, orthopnea, n, v, dizziness, syncope, edema, weight gain, or early satiety.  All other systems reviewed and are otherwise negative except as noted above.  Physical Exam    VS:  BP  110/80 (BP Location: Left Arm, Patient Position: Sitting, Cuff Size: Normal)   Pulse 82   Ht 5\' 5"  (1.651 m)   Wt 114 lb 8 oz (51.9 kg)   SpO2 99%   BMI 19.05 kg/m  , BMI Body mass index is 19.05 kg/m. GEN: Well nourished, well developed, in no acute distress. HEENT: normal. Neck: Supple, no JVD, carotid bruits, or masses. Cardiac: RRR, no murmurs, rubs, or gallops. No clubbing, cyanosis, edema.  Radials/DP/PT 2+ and equal bilaterally.    Respiratory:  Respirations regular and unlabored, clear to auscultation bilaterally. GI: Soft, nontender, nondistended, BS + x 4. MS: no deformity or atrophy. Skin: warm and dry, no rash. Neuro:  Strength and sensation are intact. Psych: Normal affect.  Accessory Clinical Findings    ECG personally reviewed by me today -regular sinus rhythm, 82, inferolateral nonpathologic Q waves.  QT 388, QTc 453 - no acute changes.  Lab Results  Component Value Date   WBC 7.2 06/29/2019   HGB 11.7 (L) 06/29/2019   HCT 35.9 (L) 06/29/2019   MCV 88.2 06/29/2019   PLT 217 06/29/2019   Lab Results  Component Value Date   CREATININE 0.70 06/29/2019   BUN 8 06/29/2019   NA 138 06/29/2019   K 3.6 06/29/2019   CL 106 06/29/2019   CO2 21 (L) 06/29/2019   Lab Results  Component Value Date   ALT 6 11/25/2016   AST 9 11/25/2016   ALKPHOS 48 11/25/2016   BILITOT 0.2 11/25/2016    Assessment & Plan    1.  Palpitations: Previously seen in the emergency department secondary to tachypalpitations in the setting of what she now describes as an anxiety/panic attack.  No recurrent palpitations since then.  She has cut back on caffeine and is currently drinking 2 Coca-Cola's a day.  She had a coworker that was causing significant anxiety at work and that person has since been fired and she has been feeling better.  No role for monitoring at this point given quiescence of symptoms.  2.  Mitral valve prolapse/mild mitral regurgitation: Recent echocardiogram showed stable mild MR.  With plan follow-up echo in 5 years or sooner if symptoms develop.  Reassurance offered.  3.  DiGeorge's syndrome: Noted history.  Mitral valve prolapse/mild MR as above.  Prior CTA of the chest showed variant anatomy of the thoracic aorta with aberrant right subclavian artery.  Also noted history of prolonged QT previously evaluated at [facility].  4.  Disposition: Follow-up in 1 year or sooner if necessary.  Murray Hodgkins,  NP 09/18/2019, 4:36 PM

## 2019-09-18 NOTE — Patient Instructions (Signed)
Medication Instructions:  None  *If you need a refill on your cardiac medications before your next appointment, please call your pharmacy*   Lab Work: None  If you have labs (blood work) drawn today and your tests are completely normal, you will receive your results only by: Marland Kitchen MyChart Message (if you have MyChart) OR . A paper copy in the mail If you have any lab test that is abnormal or we need to change your treatment, we will call you to review the results.   Testing/Procedures: None   Follow-Up: At [facility], you and your health needs are our priority.  As part of our continuing mission to provide you with exceptional heart care, we have created designated Provider Care Teams.  These Care Teams include your primary Cardiologist (physician) and Advanced Practice Providers (APPs -  Physician Assistants and Nurse Practitioners) who all work together to provide you with the care you need, when you need it.  We recommend signing up for the patient portal called "MyChart".  Sign up information is provided on this After Visit Summary.  MyChart is used to connect with patients for Virtual Visits (Telemedicine).  Patients are able to view lab/test results, encounter notes, upcoming appointments, etc.  Non-urgent messages can be sent to your provider as well.   To learn more about what you can do with MyChart, go to [email or web address].    Your next appointment:   1 year(s)  The format for your next appointment:   In Person  Provider:    You may see Dr. Azucena Cecil or one of the following Advanced Practice Providers on your designated Care Team:    Nicolasa Ducking, NP  Eula Listen, PA-C  Marisue Ivan, PA-C

## 2019-09-19 NOTE — Addendum Note (Signed)
Addended by: Thayer Headings, Shawniece Oyola L on: 09/19/2019 04:09 PM   Modules accepted: Orders

## 2020-04-08 LAB — HM PAP SMEAR: HM Pap smear: POSITIVE

## 2020-10-05 ENCOUNTER — Ambulatory Visit: Payer: Self-pay | Admitting: Cardiology

## 2020-10-30 ENCOUNTER — Ambulatory Visit: Payer: 59 | Admitting: Cardiology

## 2020-11-16 ENCOUNTER — Ambulatory Visit: Payer: 59 | Admitting: Cardiology

## 2020-11-19 ENCOUNTER — Encounter: Payer: Self-pay | Admitting: Cardiology

## 2021-04-09 ENCOUNTER — Ambulatory Visit (INDEPENDENT_AMBULATORY_CARE_PROVIDER_SITE_OTHER): Payer: 59 | Admitting: Nurse Practitioner

## 2021-04-09 ENCOUNTER — Ambulatory Visit: Payer: 59 | Admitting: Nurse Practitioner

## 2021-04-09 ENCOUNTER — Other Ambulatory Visit: Payer: Self-pay

## 2021-04-09 ENCOUNTER — Encounter: Payer: Self-pay | Admitting: Nurse Practitioner

## 2021-04-09 VITALS — BP 90/50 | HR 78 | Ht 65.0 in | Wt 135.8 lb

## 2021-04-09 DIAGNOSIS — I340 Nonrheumatic mitral (valve) insufficiency: Secondary | ICD-10-CM

## 2021-04-09 DIAGNOSIS — R002 Palpitations: Secondary | ICD-10-CM

## 2021-04-09 DIAGNOSIS — D821 Di George's syndrome: Secondary | ICD-10-CM

## 2021-04-09 DIAGNOSIS — Z01810 Encounter for preprocedural cardiovascular examination: Secondary | ICD-10-CM

## 2021-04-09 DIAGNOSIS — I341 Nonrheumatic mitral (valve) prolapse: Secondary | ICD-10-CM

## 2021-04-09 DIAGNOSIS — R9431 Abnormal electrocardiogram [ECG] [EKG]: Secondary | ICD-10-CM

## 2021-04-09 NOTE — Progress Notes (Addendum)
Office Visit    Patient Name: Sheila Camacho Date of Encounter: 04/09/2021  Primary Care Provider:  Carren Rang, PA-C Primary Cardiologist:  Prev seen by A. Alvino Chapel, MD  Chief Complaint    25 year old female with a history of DiGeorge syndrome, mitral valve prolapse with mild mitral regurgitation, palpitations, bipolar disorder, PTSD, anxiety, and prolonged QT, who presents for follow-up related to palpitations.  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 11/2016 Echo: EF 55-60%, Mild MR w/ mild prolapse; b. 07/2019 Echo: EF 50-55%, no rwma. 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   Palpitations    Prolonged QT interval    PTSD (post-traumatic stress disorder)    Past Surgical History:  Procedure Laterality Date   HERNIA REPAIR  2005    Allergies  No Known Allergies  History of Present Illness    25 year old female with the above past medical history including DiGeorge syndrome, mitral valve prolapse with mild mitral regurgitation, palpitations, bipolar disorder, PTSD, anxiety, and prolonged QT.  Prolonged QT was previously evaluated at [facility] in the setting of syncope, beginning in 2010 with normal echocardiography at that time.  She also underwent epinephrine infusion study and exercise stress testing in late 2010.  Ultimately, it was felt that prolonged QT syndrome was ruled out.  She subsequently established care with Dr. Alvino Chapel at our practice in 2018.  Echocardiogram at that time showed normal LV function with mild mitral valve prolapse.  In December 2020, she was seen in the emergency department with palpitations, dizziness, chest tightness, and dyspnea.  CT angio of the chest was negative for PE though she was noted to have thoracic aorta with a  variant anatomy and aberrant right subclavian artery.  A follow-up echocardiogram in January 2021 showed an EF of 50-55% without regional wall motion abnormalities.  Myxomatous mitral valve with mild MR was noted.  Sheila Camacho was last seen in cardiology clinic in March 2021, at which time she was asymptomatic.  Palpitations seem to resolve after improvement in management of anxiety and panic attacks and high caffeine intake.  Since her last visit, she is continued to do well.  She denies chest pain, dyspnea, palpitations, PND, orthopnea, dizziness, syncope, edema, or early satiety.  She is working at [employer] now and is much less stressful than her previous job.  She is pending wisdom teeth extractions and was advised that she would need cardiology clearance.  Home Medications    Current Outpatient Medications  Medication Sig Dispense Refill   FLUoxetine (PROZAC) 10 MG capsule Take 10 mg by mouth daily.     ketoconazole (NIZORAL) 2 % shampoo WASH HAIR WITH SHAMPOO DAILY 240 mL 1   lamoTRIgine (LAMICTAL) 25 MG tablet Take 50 mg by mouth 2 (two) times daily.     No current facility-administered medications for this visit.     Review of Systems    She denies chest pain, palpitations, dyspnea, pnd, orthopnea, n, v, dizziness, syncope, edema, weight gain, or early satiety.  All other systems reviewed and are otherwise negative except as noted above.  Physical Exam    VS:  BP (!) 90/50   Pulse 78   Ht 5\' 5"  (1.651 m)  Wt 135 lb 12.8 oz (61.6 kg)   SpO2 96%   BMI 22.60 kg/m  , BMI Body mass index is 22.6 kg/m.     GEN: Well nourished, well developed, in no acute distress. HEENT: normal. Neck: Supple, no JVD, carotid bruits, or masses. Cardiac: RRR, no murmurs, rubs, or gallops. No clubbing, cyanosis, edema.  Radials/PT 2+ and equal bilaterally.  Respiratory:  Respirations regular and unlabored, clear to auscultation bilaterally. GI: Soft, nontender, nondistended, BS + x 4. MS: no  deformity or atrophy. Skin: warm and dry, no rash. Neuro:  Strength and sensation are intact. Psych: Normal affect.  Accessory Clinical Findings    ECG personally reviewed by me today -regular sinus rhythm, 78- no acute changes.  Lab Results  Component Value Date   WBC 7.2 06/29/2019   HGB 11.7 (L) 06/29/2019   HCT 35.9 (L) 06/29/2019   MCV 88.2 06/29/2019   PLT 217 06/29/2019   Lab Results  Component Value Date   CREATININE 0.70 06/29/2019   BUN 8 06/29/2019   NA 138 06/29/2019   K 3.6 06/29/2019   CL 106 06/29/2019   CO2 21 (L) 06/29/2019   Lab Results  Component Value Date   ALT 6 11/25/2016   AST 9 11/25/2016   ALKPHOS 48 11/25/2016   BILITOT 0.2 11/25/2016   Assessment & Plan    1.  Palpitations: Quiescent over the past year after changing jobs.  Less anxiety and caffeine.  2.  Mitral valve prolapse/mild mitral regurgitation: Echocardiogram in January 2021 showed low normal LV function with mild mitral regurgitation.  Patient is asymptomatic.  No murmur on examination.  Plan for follow-up echo about 4 years or sooner if symptoms develop.  3.  DiGeorge syndrome: History of mitral valve prolapse with only mild MR by echo last year.  Prior CT of the chest showed variant anatomy of the thoracic aorta with aberrant right subclavian artery.  Also prior history of prolonged QT which was previous evaluated at [facility].  QT 420 ms on ECG today.  4.  History of prolonged QT: As above, QT 420 ms on ECG today.  5.  Preoperative cardiovascular risk assessment: Patient is pending wisdom teeth extractions (4).  She is active without chest pain or dyspnea and has normal LV function.  She is low risk for wisdom tooth extraction and may proceed without any additional ischemic testing.  There is no indication for antibiotic prophylaxis.  6.  Disposition: Follow-up in 1 year or sooner if necessary.   Nicolasa Ducking, NP 04/09/2021, 6:08 PM

## 2021-04-09 NOTE — Patient Instructions (Signed)
Medication Instructions:  No changes at this time.  *If you need a refill on your cardiac medications before your next appointment, please call your pharmacy*   Lab Work: None  If you have labs (blood work) drawn today and your tests are completely normal, you will receive your results only by: MyChart Message (if you have MyChart) OR A paper copy in the mail If you have any lab test that is abnormal or we need to change your treatment, we will call you to review the results.   Testing/Procedures: None    Follow-Up: At [facility], you and your health needs are our priority.  As part of our continuing mission to provide you with exceptional heart care, we have created designated Provider Care Teams.  These Care Teams include your primary Cardiologist (physician) and Advanced Practice Providers (APPs -  Physician Assistants and Nurse Practitioners) who all work together to provide you with the care you need, when you need it.     Your next appointment:   1 year(s)  The format for your next appointment:   In Person  Provider:   You may see or one of the following Advanced Practice Providers on your designated Care Team:   Nicolasa Ducking, NP Eula Listen, PA-C Marisue Ivan, PA-C Cadence Fransico Michael, [place]   Other Instructions Patient must establish with one of our cardiologist for next appointment.

## 2021-11-10 DIAGNOSIS — R059 Cough, unspecified: Secondary | ICD-10-CM | POA: Diagnosis not present

## 2021-11-10 DIAGNOSIS — J4531 Mild persistent asthma with (acute) exacerbation: Secondary | ICD-10-CM | POA: Diagnosis not present

## 2022-05-31 ENCOUNTER — Ambulatory Visit (LOCAL_COMMUNITY_HEALTH_CENTER): Payer: Self-pay | Admitting: Family Medicine

## 2022-05-31 ENCOUNTER — Ambulatory Visit: Payer: Self-pay

## 2022-05-31 VITALS — BP 102/58 | Ht 65.0 in | Wt 122.5 lb

## 2022-05-31 VITALS — BP 102/58 | Ht 65.0 in | Wt 122.0 lb

## 2022-05-31 DIAGNOSIS — Z3009 Encounter for other general counseling and advice on contraception: Secondary | ICD-10-CM

## 2022-05-31 DIAGNOSIS — Z30012 Encounter for prescription of emergency contraception: Secondary | ICD-10-CM

## 2022-05-31 DIAGNOSIS — Z7251 High risk heterosexual behavior: Secondary | ICD-10-CM

## 2022-05-31 DIAGNOSIS — Z30013 Encounter for initial prescription of injectable contraceptive: Secondary | ICD-10-CM

## 2022-05-31 MED ORDER — ULIPRISTAL ACETATE 30 MG PO TABS: 30.0000 mg | ORAL_TABLET | Freq: Once | ORAL | Status: DC

## 2022-05-31 MED ORDER — MEDROXYPROGESTERONE ACETATE 150 MG/ML IM SUSP: 150.0000 mg | INTRAMUSCULAR | Status: AC

## 2022-05-31 NOTE — Progress Notes (Signed)
In nurse clinic requesting emergency contraception and wants to start ocp.   Per pt,  Last sex 05/27/2022 and 04/24/2022 LMP 05/15/2022  Hx Depo, unsure of date of last injection. Reported decreased appetite while on depo and taking rx meds, (prozac and mirtazapine) Takes no meds currently. Stopped prozac 01/2022. No counselor presently. Reports increased stress and does meditation and walks to deal with it.   No hx migraine headaches, only occ headache if has too much caffeine.  Denies smoking, vaping.   Per pt, treated 2 weeks ago for UTI.   Consult with Dr Karyl Kinnier who agrees for pt to be seen in [facility] now d/t appt availability for evaluation including EC and birth control method.  RN walked pt to [facility] exam room. [facility] staff notifiied. Jerel Shepherd, RN

## 2022-05-31 NOTE — Progress Notes (Unsigned)
Pt appointment for Emergency contraceptive. Pt interested in starting birth control. Seen by MD Alvester Morin for full annual physical. Family planning packet provided. Pt counseled to come back after Thanksgiving for a pregnancy test, and if it's negative to start on Depo.

## 2022-05-31 NOTE — Progress Notes (Signed)
[facility] [place] Number: [phone]   Family Planning --Acute Visit  Subjective:  Sheila Camacho is a 26 y.o.  No obstetric history on file.   being seen today for an initial annual visit and to discuss reproductive life planning.  The patient is currently using Female Condom for pregnancy prevention. Patient reports   does not want a pregnancy in the next year.    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  Chief Complaint  Patient presents with   Contraception    Patient reports unprotected sex on 11/10. She reports prior unprotected sex was 11/7 and LMP was Patient's last menstrual period was 05/15/2022.   She is very much does not desire a pregnancy. She was initially scheduled in RN clinic for Plan B alone but RN came to discuss with me as the patient desired a method and there are options for IUD insertion that I can discuss with the patient.   Patient denies migraines, known history of cHTN   BMI 23- Patient is eligible for diabetes screening based on BMI and age >30?  no HA1C ordered? no  Patient reports 1  partner/s in last year. Desires STI screening?  No - does not want this today    Health Maintenance Due  Topic Date Due   HPV VACCINES (1 - 2-dose series) Never done   HIV Screening  Never done   Hepatitis C Screening  Never done   PAP-Cervical Cytology Screening  Never done   PAP SMEAR-Modifier  Never done   INFLUENZA VACCINE  02/15/2022    Review of Systems  Constitutional:  Negative for chills and fever.  Eyes:   Negative for blurred vision and double vision.  Respiratory:  Negative for cough and shortness of breath.   Cardiovascular:  Negative for chest pain and orthopnea.  Gastrointestinal:  Negative for nausea and vomiting.  Genitourinary:  Negative for dysuria, flank pain and frequency.  Musculoskeletal:  Negative for myalgias.  Skin:  Negative for rash.  Neurological:  Negative for dizziness, tingling, weakness and headaches.  Endo/Heme/Allergies:  Does not bruise/bleed easily.  Psychiatric/Behavioral:  Negative for depression and suicidal ideas. The patient is not nervous/anxious.     The following portions of the patient's history were reviewed and updated as appropriate: allergies, current medications, past family history, past medical history, past social history, past surgical history and problem list. Problem list updated.   See flowsheet for other program required questions.  Objective:   Vitals:   06/01/22 1653  BP: (!) 102/58  Weight: 122 lb (55.3 kg)  Height: _0  (1.651 m)    Physical Exam Constitutional:      Appearance: Normal appearance.  HENT:     Head: Normocephalic and atraumatic.  Pulmonary:     Effort: Pulmonary effort is normal.  Abdominal:     Palpations: Abdomen is soft.  Musculoskeletal:        General: Normal range of motion.  Skin:    General: Skin is warm and dry.  Neurological:     General: No focal deficit present.     Mental Status: She is alert.  Psychiatric:  Mood and Affect: Mood normal.        Behavior: Behavior normal.       Assessment and Plan:  Sheila Camacho is a 26 y.o. female presenting to the [facility] Department for an initial annual wellness/contraceptive visit  Contraception counseling: Reviewed options based on patient desire and reproductive life plan. Patient is interested in Hormonal Injection. This was provided to the patient today. She is currently at risk for pregnancy and need   Risks,  benefits, and typical effectiveness rates were reviewed.  Questions were answered.  Written information was also given to the patient to review.    The patient will follow up in  1.5 weeks for surveillance.  The patient was told to call with any further questions, or with any concerns about this method of contraception.  Emphasized use of condoms 100% of the time for STI prevention.  Need for ECP was assessed. Patient reported Unprotected sex within past 120 hours.  Reviewed options and patient desired Ella (Ulipristal)   1. Unprotected sex Talked openly about options for ECP- reviewed option for IUS/IUD given she is currently just over 72 hrs post unprotected sex Patient declined IUD insertion as her mother and sister have had complications with IUDs Reviewed use of Festus Holts as progesterone blocker from pregnancy prevent.   Patient is confident that she can return for depo. Discussed that Ella+depo decreases effectiveness of Ella.  Recommended abstaining from sex until seen for depo administration.  Recommended urine pregnancy test at home on 11/24 Reviewed  - ulipristal acetate (ELLA) tablet 30 mg - medroxyPROGESTERone (DEPO-PROVERA) injection 150 mg  2. Encounter for initial prescription of injectable contraceptive Reviewed options for starting contraception - medroxyPROGESTERone (DEPO-PROVERA) injection 150 mg  3. HM - last pap was at [facility] 04/11/20= ASCUS HPV positive - Needs repeat pap-- address this at future visits as it was not the goal of the visit today since the client needed emergency contraceptive. She declined a pelvic today  Return in about 13 days (around 06/13/2022) for UPT/Depo.  No future appointments.  Caren Macadam, MD

## 2022-06-01 ENCOUNTER — Encounter: Payer: Self-pay | Admitting: Family Medicine

## 2022-06-06 ENCOUNTER — Encounter: Payer: Self-pay | Admitting: Nurse Practitioner

## 2022-06-06 NOTE — Progress Notes (Signed)
Abstract PAP results. Glenna Fellows, FNP

## 2022-09-16 ENCOUNTER — Telehealth: Payer: Self-pay | Admitting: Family Medicine

## 2023-02-16 NOTE — Telephone Encounter (Signed)
No Additional Notes

## 2023-03-02 ENCOUNTER — Emergency Department
Admission: EM | Admit: 2023-03-02 | Discharge: 2023-03-02 | Discharge status: Against Medical Advice | Payer: Self-pay | Attending: Emergency Medicine | Admitting: Emergency Medicine

## 2023-03-02 ENCOUNTER — Other Ambulatory Visit: Payer: Self-pay

## 2023-03-02 ENCOUNTER — Encounter: Payer: Self-pay | Admitting: Emergency Medicine

## 2023-03-02 DIAGNOSIS — H538 Other visual disturbances: Secondary | ICD-10-CM | POA: Insufficient documentation

## 2023-03-02 DIAGNOSIS — Z5321 Procedure and treatment not carried out due to patient leaving prior to being seen by health care provider: Secondary | ICD-10-CM | POA: Insufficient documentation

## 2023-03-02 DIAGNOSIS — R519 Headache, unspecified: Secondary | ICD-10-CM | POA: Insufficient documentation

## 2023-03-02 DIAGNOSIS — R110 Nausea: Secondary | ICD-10-CM | POA: Insufficient documentation

## 2023-03-02 LAB — BASIC METABOLIC PANEL
Anion gap: 8 (ref 5–15)
BUN: 12 mg/dL (ref 6–20)
CO2: 22 mmol/L (ref 22–32)
Calcium: 8.8 mg/dL — ABNORMAL LOW (ref 8.9–10.3)
Chloride: 104 mmol/L (ref 98–111)
Creatinine, Ser: 0.73 mg/dL (ref 0.44–1.00)
GFR, Estimated: >60 mL/min (ref >60)
Glucose, Bld: 91 mg/dL (ref 70–99)
Potassium: 3.6 mmol/L (ref 3.5–5.1)
Sodium: 134 mmol/L — ABNORMAL LOW (ref 135–145)

## 2023-03-02 LAB — CBC
HCT: 35 % — ABNORMAL LOW (ref 36.0–46.0)
Hemoglobin: 11.7 g/dL — ABNORMAL LOW (ref 12.0–15.0)
MCH: 30.3 pg (ref 26.0–34.0)
MCHC: 33.4 g/dL (ref 30.0–36.0)
MCV: 90.7 fL (ref 80.0–100.0)
Platelets: 246 10*3/uL (ref 150–400)
RBC: 3.86 MIL/uL — ABNORMAL LOW (ref 3.87–5.11)
RDW: 13.5 % (ref 11.5–15.5)
WBC: 8.2 10*3/uL (ref 4.0–10.5)
nRBC: 0 % (ref 0.0–0.2)

## 2023-03-02 NOTE — ED Triage Notes (Signed)
First Nurse Note;  Pt via POV from [facility]. Pt c/o headache, blurry vision for the past 3 days. Pt is A&Ox4 and NAD

## 2023-05-22 ENCOUNTER — Ambulatory Visit: Payer: Self-pay | Attending: Cardiology | Admitting: Cardiology

## 2023-10-26 ENCOUNTER — Emergency Department
Admission: EM | Admit: 2023-10-26 | Discharge: 2023-10-27 | Disposition: A | Discharge status: Other Acute Inpatient Hospital | Payer: Self-pay | Attending: Emergency Medicine | Admitting: Emergency Medicine

## 2023-10-26 DIAGNOSIS — R45851 Suicidal ideations: Secondary | ICD-10-CM

## 2023-10-26 DIAGNOSIS — T450X2A Poisoning by antiallergic and antiemetic drugs, intentional self-harm, initial encounter: Secondary | ICD-10-CM | POA: Diagnosis not present

## 2023-10-26 DIAGNOSIS — F332 Major depressive disorder, recurrent severe without psychotic features: Secondary | ICD-10-CM | POA: Insufficient documentation

## 2023-10-26 LAB — CBC
HCT: 41.3 % (ref 36.0–46.0)
Hemoglobin: 13.7 g/dL (ref 12.0–15.0)
MCH: 29.8 pg (ref 26.0–34.0)
MCHC: 33.2 g/dL (ref 30.0–36.0)
MCV: 89.8 fL (ref 80.0–100.0)
Platelets: 274 10*3/uL (ref 150–400)
RBC: 4.6 MIL/uL (ref 3.87–5.11)
RDW: 12.9 % (ref 11.5–15.5)
WBC: 8.4 10*3/uL (ref 4.0–10.5)
nRBC: 0 % (ref 0.0–0.2)

## 2023-10-26 LAB — COMPREHENSIVE METABOLIC PANEL WITH GFR
ALT: 11 U/L (ref 0–44)
AST: 18 U/L (ref 15–41)
Albumin: 4.6 g/dL (ref 3.5–5.0)
Alkaline Phosphatase: 67 U/L (ref 38–126)
Anion gap: 11 (ref 5–15)
BUN: 7 mg/dL (ref 6–20)
CO2: 20 mmol/L — ABNORMAL LOW (ref 22–32)
Calcium: 9 mg/dL (ref 8.9–10.3)
Chloride: 106 mmol/L (ref 98–111)
Creatinine, Ser: 0.67 mg/dL (ref 0.44–1.00)
GFR, Estimated: >60 mL/min (ref >60)
Glucose, Bld: 77 mg/dL (ref 70–99)
Potassium: 3.1 mmol/L — ABNORMAL LOW (ref 3.5–5.1)
Sodium: 137 mmol/L (ref 135–145)
Total Bilirubin: 0.9 mg/dL (ref 0.0–1.2)
Total Protein: 8.2 g/dL — ABNORMAL HIGH (ref 6.5–8.1)

## 2023-10-26 LAB — ACETAMINOPHEN LEVEL
Acetaminophen (Tylenol), Serum: <10 ug/mL — ABNORMAL LOW (ref 10–30)
Acetaminophen (Tylenol), Serum: <10 ug/mL — ABNORMAL LOW (ref 10–30)

## 2023-10-26 LAB — SALICYLATE LEVEL: Salicylate Lvl: <7 mg/dL — ABNORMAL LOW (ref 7.0–30.0)

## 2023-10-26 LAB — MAGNESIUM: Magnesium: 2.1 mg/dL (ref 1.7–2.4)

## 2023-10-26 LAB — ETHANOL: Alcohol, Ethyl (B): <10 mg/dL (ref <10)

## 2023-10-26 NOTE — BH Assessment (Signed)
 Comprehensive Clinical Assessment (CCA) Screening, Triage and Referral Note  10/26/2023 Sheila Camacho [id number] Recommendations for Services/Supports/Treatments: Disposition pending. Sheila Camacho is a 28 year old, Caucasian, Not Hispanic or Latino ethnicity, ENGLISH speaking female. Pt is under IVC. Per triage note: Pt to ED via POV from home with sister. Sister reports came home from work and pt. was getting out of bathtub soaking wet and reporting she took some pills to harm herself. Pt took 8 50mg  Raxell Benadryl sleep aids to fall asleep and not wake up. Pt denies etoh use. Pt reports increased life stressors that she does not want to disclose in triage.  On assessment, the patient was expansive about her reasons for presenting to the hospital. Pt explained that she needs to get back on her medications due to having worsening anger and lashing out on her family. Pt also reported that she is grieving the birthday of her deceased mother and she has not been functioning well. Pt reported that for months she has been having worsening anger and that her mood has been generally irritable. Pt requested any mental health treatment available/recommended, including medication management and therapy. Pt identified having financial strain and religious issues due to her being pagan which conflicts with her family's religion. Pt had good insight, explaining that she needs to address his mental health issues explaining that she doesn't want to continue being self-injurious. Pt had clear and coherent speech. Pt's thoughts were intact and relevant. Pt was oriented x4. Pt presented with a depressed mood; affect was congruent. The patient denied current SI, HI or AV/H. Pt expressed regret about her intentional ingestion, explaining that her attempt was a cry for help.   Chief Complaint:  Chief Complaint  Patient presents with   Ingestion   Visit Diagnosis: MDD, recurrent severe  Patient Reported  Information How did you hear about [place]? Self  What Is the Reason for Your Visit/Call Today? No data recorded How Long Has This Been Causing You Problems? No data recorded What Do You Feel Would Help You the Most Today? No data recorded  Have You Recently Had Any Thoughts About Hurting Yourself? No data recorded Are You Planning to Commit Suicide/Harm Yourself At This time? No data recorded  Have you Recently Had Thoughts About Hurting Someone Karolee Ohs? No data recorded Are You Planning to Harm Someone at This Time? No data recorded Explanation: No data recorded  Have You Used Any Alcohol or Drugs in the Past 24 Hours? No data recorded How Long Ago Did You Use Drugs or Alcohol? No data recorded What Did You Use and How Much? No data recorded  Do You Currently Have a Therapist/Psychiatrist? No data recorded Name of Therapist/Psychiatrist: No data recorded  Have You Been Recently Discharged From Any Office Practice or Programs? No data recorded Explanation of Discharge From Practice/Program: No data recorded   CCA Screening Triage Referral Assessment Type of Contact: No data recorded Telemedicine Service Delivery:   Is this Initial or Reassessment?   Date Telepsych consult ordered in [facility]:    Time Telepsych consult ordered in [facility]:    Location of Assessment: No data recorded Provider Location: No data recorded   Collateral Involvement: No data recorded  Does Patient Have a Court Appointed Legal Guardian? No data recorded Name and Contact of Legal Guardian: No data recorded If Minor and Not Living with Parent(s), Who has Custody? No data recorded Is CPS involved or ever been involved? No data recorded Is APS involved or ever been involved?  No data recorded  Patient Determined To Be At Risk for Harm To Self or Others Based on Review of Patient Reported Information or Presenting Complaint? No data recorded Method: No data recorded Availability of Means: No data recorded Intent: No data  recorded Notification Required: No data recorded Additional Information for Danger to Others Potential: No data recorded Additional Comments for Danger to Others Potential: No data recorded Are There Guns or Other Weapons in Your Home? No data recorded Types of Guns/Weapons: No data recorded Are These Weapons Safely Secured?                            No data recorded Who Could Verify You Are Able To Have These Secured: No data recorded Do You Have any Outstanding Charges, Pending Court Dates, Parole/Probation? No data recorded Contacted To Inform of Risk of Harm To Self or Others: No data recorded  Does Patient Present under Involuntary Commitment? No data recorded   [place] of Residence: No data recorded  Patient Currently Receiving the Following Services: No data recorded  Determination of Need: No data recorded  Options For Referral: No data recorded  Disposition Recommendation per psychiatric provider: Pending  Feliza Diven R Phoebie Shad, LCAS

## 2023-10-26 NOTE — ED Notes (Signed)
Water given  

## 2023-10-26 NOTE — ED Notes (Addendum)
 Poison controlled called at this time and spoken to nurse Jetty Duhamel.  Recommeneds 4H Tylenol levels  6H observations Cardiac monitoring and be aware of signs of agitation,delirium and seizures if so then consider Benzos.

## 2023-10-26 NOTE — ED Notes (Signed)
 CCMD called and pt placed on monitoring.

## 2023-10-26 NOTE — ED Notes (Addendum)
*  FAMILY TOOK BELONGINGS HOME*  Pt dressed out by this RN and EDT:  1 black shirt  1 black jacket  1 pair black jackets 1 pair black leggings  1 green hair tie  1 cell phone

## 2023-10-26 NOTE — ED Triage Notes (Addendum)
 Pt to ED via POV from home with sister. Sister reports came home from work and pt was getting out of bathtub soaking wet and reporting she took some pills to harm herself. Pt took 8 50mg  Raxell Benadryl sleep aids to fall asleep and not wake up. Pt denies etoh use.Pt reports increased life stressors that she does not want to disclose in triage. Pt A&Ox4 in triage. Pt lips are very dry

## 2023-10-26 NOTE — ED Provider Notes (Signed)
 [facility] Provider Note    Event Date/Time   First MD Initiated Contact with Patient 10/26/23 1638     (approximate)   History   Ingestion   HPI  Sheila Camacho is a 28 y.o. female who presents to the emergency department today because of concerns for ingestion.  Patient states that she took multiple 50 mg Benadryl's today.  She was doing an attempt to kill herself.  Patient states that she has been depressed for a long time.  She has not been able to speak to anybody about this.  Says she has tried to harm herself in the past. The patient states that she feels a little tired currently.  Denies any headache, chest pain or abdominal pain.     Physical Exam   Triage Vital Signs: ED Triage Vitals  Encounter Vitals Group     BP 10/26/23 1618 (!) 134/99     Systolic BP Percentile --      Diastolic BP Percentile --      Pulse Rate 10/26/23 1618 (!) 129     Resp 10/26/23 1618 20     Temp 10/26/23 1618 98 F (36.7 C)     Temp Source 10/26/23 1618 Oral     SpO2 10/26/23 1618 98 %     Weight 10/26/23 1619 107 lb (48.5 kg)     Height 10/26/23 1619 5\' 7"  (1.702 m)     Head Circumference --      Peak Flow --      Pain Score 10/26/23 1619 0     Pain Loc --      Pain Education --      Exclude from Growth Chart --     Most recent vital signs: Vitals:   10/26/23 1618  BP: (!) 134/99  Pulse: (!) 129  Resp: 20  Temp: 98 F (36.7 C)  SpO2: 98%   General: Awake, alert, oriented. CV:  Good peripheral perfusion. Tachycardia. Resp:  Normal effort. Lungs clear. Abd:  No distention. Non tender.   ED Results / Procedures / Treatments   Labs (all labs ordered are listed, but only abnormal results are displayed) Labs Reviewed  COMPREHENSIVE METABOLIC PANEL WITH GFR - Abnormal; Notable for the following components:      Result Value   Potassium 3.1 (*)    CO2 20 (*)    Total Protein 8.2 (*)    All other components within normal limits  CBC   ETHANOL  SALICYLATE LEVEL  ACETAMINOPHEN LEVEL  URINE DRUG SCREEN, QUALITATIVE ([facility] ONLY)  POC URINE PREG, ED     EKG  I, Phineas Semen, attending physician, personally viewed and interpreted this EKG  EKG Time: 1624 Rate: 119 Rhythm: sinus tachycardia Axis: normal Intervals: qtc 497 QRS: narrow ST changes: no st elevation Impression: abnormal ekg   RADIOLOGY None   PROCEDURES:  Critical Care performed: No    MEDICATIONS ORDERED IN ED: Medications - No data to display   IMPRESSION / MDM / ASSESSMENT AND PLAN / ED COURSE  I reviewed the triage vital signs and the nursing notes.                              Differential diagnosis includes, but is not limited to, SI, depression, drug induced mood disorder  Patient's presentation is most consistent with acute presentation with potential threat to life or bodily function.  Patient presents to the  emergency department today because of concerns for intentional overdose and attempt to harm herself.  Patient does admit to wanting to kill herself.  Will check blood work.  Poison control was contacted and recommended at least a 6-hour medical observation.  Will have psychiatry evaluate.   The patient has been placed in psychiatric observation due to the need to provide a safe environment for the patient while obtaining psychiatric consultation and evaluation, as well as ongoing medical and medication management to treat the patient's condition.  The patient has been placed under full IVC at this time.    FINAL CLINICAL IMPRESSION(S) / ED DIAGNOSES   Final diagnoses:  Suicidal ideation      Note:  This document was prepared using Dragon voice recognition software and may include unintentional dictation errors.    Phineas Semen, MD 10/26/23 [phone]

## 2023-10-26 NOTE — ED Notes (Signed)
 IVC/Psych Consult Ordered/Pending/ Legal Paper work completed scanned & E-filed into chart

## 2023-10-27 ENCOUNTER — Other Ambulatory Visit: Payer: Self-pay

## 2023-10-27 ENCOUNTER — Inpatient Hospital Stay (HOSPITAL_COMMUNITY)
Admission: AD | Admit: 2023-10-27 | Discharge: 2023-11-01 | DRG: 918 | Disposition: A | Discharge status: Home / Self Care | Source: Intra-hospital | Attending: Psychiatry | Admitting: Psychiatry

## 2023-10-27 ENCOUNTER — Encounter (HOSPITAL_COMMUNITY): Payer: Self-pay | Admitting: Psychiatry

## 2023-10-27 DIAGNOSIS — Z825 Family history of asthma and other chronic lower respiratory diseases: Secondary | ICD-10-CM

## 2023-10-27 DIAGNOSIS — F332 Major depressive disorder, recurrent severe without psychotic features: Secondary | ICD-10-CM | POA: Diagnosis present

## 2023-10-27 DIAGNOSIS — Z634 Disappearance and death of family member: Secondary | ICD-10-CM

## 2023-10-27 DIAGNOSIS — Z560 Unemployment, unspecified: Secondary | ICD-10-CM | POA: Diagnosis not present

## 2023-10-27 DIAGNOSIS — Z91018 Allergy to other foods: Secondary | ICD-10-CM | POA: Diagnosis not present

## 2023-10-27 DIAGNOSIS — F32A Depression, unspecified: Secondary | ICD-10-CM | POA: Diagnosis not present

## 2023-10-27 DIAGNOSIS — F909 Attention-deficit hyperactivity disorder, unspecified type: Secondary | ICD-10-CM | POA: Diagnosis present

## 2023-10-27 DIAGNOSIS — R41843 Psychomotor deficit: Secondary | ICD-10-CM | POA: Diagnosis present

## 2023-10-27 DIAGNOSIS — R45851 Suicidal ideations: Secondary | ICD-10-CM

## 2023-10-27 DIAGNOSIS — T450X2A Poisoning by antiallergic and antiemetic drugs, intentional self-harm, initial encounter: Secondary | ICD-10-CM

## 2023-10-27 DIAGNOSIS — F411 Generalized anxiety disorder: Secondary | ICD-10-CM | POA: Diagnosis present

## 2023-10-27 DIAGNOSIS — R9431 Abnormal electrocardiogram [ECG] [EKG]: Secondary | ICD-10-CM | POA: Diagnosis present

## 2023-10-27 DIAGNOSIS — Z818 Family history of other mental and behavioral disorders: Secondary | ICD-10-CM

## 2023-10-27 DIAGNOSIS — F4310 Post-traumatic stress disorder, unspecified: Secondary | ICD-10-CM | POA: Diagnosis present

## 2023-10-27 DIAGNOSIS — Z8249 Family history of ischemic heart disease and other diseases of the circulatory system: Secondary | ICD-10-CM

## 2023-10-27 DIAGNOSIS — F329 Major depressive disorder, single episode, unspecified: Secondary | ICD-10-CM | POA: Diagnosis present

## 2023-10-27 DIAGNOSIS — D821 Di George's syndrome: Secondary | ICD-10-CM | POA: Diagnosis present

## 2023-10-27 MED ORDER — LORAZEPAM 2 MG/ML IJ SOLN: 2.0000 mg | Freq: Three times a day (TID) | INTRAMUSCULAR | Status: DC | PRN

## 2023-10-27 MED ORDER — ACETAMINOPHEN 325 MG PO TABS: 650.0000 mg | ORAL_TABLET | Freq: Four times a day (QID) | ORAL | Status: DC | PRN

## 2023-10-27 MED ORDER — HALOPERIDOL 5 MG PO TABS: 5.0000 mg | ORAL_TABLET | Freq: Three times a day (TID) | ORAL | Status: DC | PRN

## 2023-10-27 MED ORDER — DIPHENHYDRAMINE HCL 25 MG PO CAPS: 50.0000 mg | ORAL_CAPSULE | Freq: Three times a day (TID) | ORAL | Status: DC | PRN

## 2023-10-27 MED ORDER — DIPHENHYDRAMINE HCL 50 MG/ML IJ SOLN: 50.0000 mg | Freq: Three times a day (TID) | INTRAMUSCULAR | Status: DC | PRN

## 2023-10-27 MED ORDER — POTASSIUM CHLORIDE CRYS ER 20 MEQ PO TBCR
20.0000 meq | EXTENDED_RELEASE_TABLET | Freq: Once | ORAL | Status: AC
  Administered 2023-10-27: 20 meq via ORAL
  Filled 2023-10-27: qty 1

## 2023-10-27 MED ORDER — ALUM & MAG HYDROXIDE-SIMETH 200-200-20 MG/5ML PO SUSP: 30.0000 mL | ORAL | Status: DC | PRN

## 2023-10-27 MED ORDER — MAGNESIUM HYDROXIDE 400 MG/5ML PO SUSP: 30.0000 mL | Freq: Every day | ORAL | Status: DC | PRN

## 2023-10-27 MED ORDER — HALOPERIDOL LACTATE 5 MG/ML IJ SOLN: 10.0000 mg | Freq: Three times a day (TID) | INTRAMUSCULAR | Status: DC | PRN

## 2023-10-27 MED ORDER — TRAZODONE HCL 50 MG PO TABS: 50.0000 mg | ORAL_TABLET | Freq: Every evening | ORAL | Status: DC | PRN

## 2023-10-27 MED ORDER — HALOPERIDOL LACTATE 5 MG/ML IJ SOLN: 5.0000 mg | Freq: Three times a day (TID) | INTRAMUSCULAR | Status: DC | PRN

## 2023-10-27 MED ORDER — TRAZODONE HCL 50 MG PO TABS: 50.0000 mg | ORAL_TABLET | Freq: Every day | ORAL | Status: DC

## 2023-10-27 NOTE — ED Notes (Signed)
 Pt Refused shower stated she was asleep

## 2023-10-27 NOTE — ED Notes (Signed)
IVC /psych consult pending 

## 2023-10-27 NOTE — ED Notes (Signed)
 Pt requests IV be removed, per EDP pt is medically cleared, IV removed

## 2023-10-27 NOTE — Progress Notes (Signed)
 Pt admitted IVC to [facility] adult unit.  Pt admitted after taking sleeping pills in suicide attempt.  Pt reports stressors included needing money for rent.  Pt states she lives with her dad. Pt reported she was here as a patient when she was 10 but does not remember the reason for admission.  Pt stated she has never had SI prior to or since Thursday and she just wants to go home.  Pt currently denies SI, HI, AVH.  Needs assessed.  Pt denied.  Safety checks continue for patient safety.  Pt safe on unit.

## 2023-10-27 NOTE — Progress Notes (Signed)
 Patient arrived to the unit via [employer] escort at [phone]. Skin check and vital sign done at this time. Report will be given to incoming nurse to continue with admission. Patient skin is intact, no tears, track marks, and bruising noted, Patient has tattoo on posterior upper back, bilateral posterior legs, and at the back of left palm. Patient has been oriented to unit, and she verbalized understanding of unit tour. Staff will continue to provide support to patient.

## 2023-10-27 NOTE — ED Provider Notes (Addendum)
 Emergency Medicine Observation Re-evaluation Note  Sheila Camacho is a 28 y.o. female, seen on rounds today.  Pt initially presented to the ED for complaints of Ingestion  Currently, the patient is is no acute distress. Denies any concerns at this time.  Physical Exam  Blood pressure 97/60, pulse 75, temperature 98.2 F (36.8 C), temperature source Axillary, resp. rate 16, height 5\' 7"  (1.702 m), weight 48.5 kg, last menstrual period 10/26/2023, SpO2 97%.  Physical Exam: General: No apparent distress Pulm: Normal WOB Neuro: Moving all extremities Psych: Resting comfortably     ED Course / MDM     I have reviewed the labs performed to date as well as medications administered while in observation.  Recent changes in the last 24 hours include: No acute events overnight.  Potassium replaced  Plan   Current plan: Psychiatry recommended inpatient placement.  Patient was accepted to [facility] in [place]. Patient is under full IVC at this time.    Corena Herter, MD 10/27/23 1610    Corena Herter, MD 10/27/23 1259    Corena Herter, MD 10/27/23 1407

## 2023-10-27 NOTE — ED Notes (Signed)
Dinner tray and beverage provided 

## 2023-10-27 NOTE — ED Notes (Signed)
 Pt set up with breakfast tray at bedside, does not wish to eat at this time.

## 2023-10-27 NOTE — ED Notes (Signed)
 Report given to [employer]  \

## 2023-10-27 NOTE — ED Notes (Addendum)
 Attempted to give report, no answer at this time. Message left with return number

## 2023-10-27 NOTE — Consult Note (Addendum)
 [facility] Psychiatric Consult Initial  Patient Name: .Sheila Camacho  MRN: [id number]  DOB: 06/02/1996  Consult Order details:  Orders (From admission, onward)     Start     Ordered   10/26/23 1908  IP CONSULT TO PSYCHIATRY       Ordering Provider: Phineas Semen, MD  Provider:  (Not yet assigned)  Question Answer Comment  Place call to: psychiatry   Reason for Consult Consult   Diagnosis/Clinical Info for Consult: overdose, si      10/26/23 1908   10/26/23 1908  CONSULT TO CALL ACT TEAM       Ordering Provider: Phineas Semen, MD  Provider:  (Not yet assigned)  Question:  Reason for Consult?  Answer:  overdose si   10/26/23 1908             Mode of Visit: Tele-visit Virtual Statement:TELE PSYCHIATRY ATTESTATION & CONSENT As the provider for this telehealth consult, I attest that I verified the patient's identity using two separate identifiers, introduced myself to the patient, provided my credentials, disclosed my location, and performed this encounter via a HIPAA-compliant, real-time, face-to-face, two-way, interactive audio and video platform and with the full consent and agreement of the patient (or guardian as applicable.) Patient physical location: [facility]. Telehealth provider physical location: home office in state of [place].   Video start time: 8:30 AM Video end time: 8:50 AM    Psychiatry Consult Evaluation  Service Date: October 27, 2023 LOS:  LOS: 0 days  Chief Complaint " I tried to kill myself"  Primary Psychiatric Diagnoses  Suicidal ideation with intentional overdose 2.  Depression   Assessment  Sheila Camacho is a 28 y.o. female presented to [facility] ED due to intentional overdose.  Patient reports overdosing on Benadryl, stating that her family has nothing to do with her.  She reports experiencing multiple life stressors, stating that her mother completed suicide in 2023.  Per patient, she has a psychiatric history of ADHD, depression, GAD, bipolar  disorder and insomnia.  She reports not being prescribed any psychotropic medications to manage symptoms.  Patient states that her last visit at [facility] clinic was a year ago, stating that her PCP at this clinic managed her psychiatric medications.  Patient unable to recall her previous medication regimen but states that she did take trazodone which was effective.  Patient also recalls taking Lamictal.  She reports being noncompliant with this medication regimen for the past year. Patient reports difficulties sleeping stating that she sleeps approximately 4 hours nightly.  We will restart trazodone 50 mg as needed for sleep.  Patient denies homicidal ideations, AVH, paranoia or delusional thought.  Given her current suicidal ideations, intentional overdose and psychiatric history, patient meets criteria for inpatient psychiatric hospitalization.   Diagnoses:  Active Hospital problems: Active Problems:   Suicidal ideation    Plan   ## Psychiatric Medication Recommendations:  Restart trazodone 50 mg at bedtime as needed for sleep  ## Medical Decision Making Capacity: Not specifically addressed in this encounter  ## Further Work-up:  -- Deferred to ED P EKG -- most recent EKG on 10/26/2023 had QtC of 497 -- Pertinent labwork reviewed earlier this admission includes: CMP, CBC, acetaminophen and salicylate level, ethyl alcohol negative   ## Disposition:-- We recommend inpatient psychiatric hospitalization after medical hospitalization. Patient has been involuntarily committed on 10/26/2023.   ## Behavioral / Environmental: - No specific recommendations at this time.     ## Safety and Observation Level:  -  Based on my clinical evaluation, I estimate the patient to be at low risk of self harm in the current setting. - At this time, we recommend routine. This decision is based on my review of the chart including patient's history and current presentation, interview of the patient, mental  status examination, and consideration of suicide risk including evaluating suicidal ideation, plan, intent, suicidal or self-harm behaviors, risk factors, and protective factors. This judgment is based on our ability to directly address suicide risk, implement suicide prevention strategies, and develop a safety plan while the patient is in the clinical setting. Please contact our team if there is a concern that risk level has changed.  CSSR Risk Category:C-SSRS RISK CATEGORY: High Risk  Suicide Risk Assessment: Patient has following modifiable risk factors for suicide: active suicidal ideation, untreated depression, medication noncompliance, and lack of access to outpatient mental health resources, which we are addressing by inpatient psychiatry. Patient has following non-modifiable or demographic risk factors for suicide: psychiatric hospitalization Patient has the following protective factors against suicide: Cultural, spiritual, or religious beliefs that discourage suicide  Thank you for this consult request. Recommendations have been communicated to the primary team.  We will recommend inpatient psychiatry once patient is medically cleared at this time.   Mcneil Sober, NP       History of Present Illness  Relevant Aspects of Hospital ED Course:  Admitted on 10/26/2023 for suicidal ideation with intentional overdose.  Patient Report:  I tried to kill myself  Psych ROS:  Depression: Yes current presentation and history of depression Anxiety: Yes history of generalized anxiety disorder Mania (lifetime and current): Denies but history of bipolar disorder per patient Psychosis: (lifetime and current): Denies    Review of Systems  Psychiatric/Behavioral:  Positive for depression and suicidal ideas. Negative for hallucinations and substance abuse.   All other systems reviewed and are negative.    Psychiatric and Social History  Psychiatric History:  Information collected from patient,  ED treatment team  Prev Dx/Sx: ADHD "GAD, depression, bipolar disorder, insomnia" Current Psych Provider: PCP prescribed psychotropic medications at [facility] (last visit one year ago) Home Meds (current): none Previous Med Trials: Lamictal and trazodone Therapy: yes when 28 years old  Prior Psych Hospitalization: one prior psychiatric hospitalization at 28 yo in [place], [place]  Prior Self Harm: denies Prior Violence: denies  Family Psych History: denies Family Hx suicide: mother suicide 2023  Social History:  Developmental Hx: Delay Educational Hx: high school diploma Occupational Hx: Scientist, research (life sciences) Hx: denies Living Situation: lives with father Spiritual Hx: [place] Access to weapons/lethal means: denies   Substance History Alcohol: denies  Type of alcohol n/a Last Drink n/a Number of drinks per day n/a History of alcohol withdrawal seizures denies History of DT's denies Tobacco: denies Illicit drugs: denies Prescription drug abuse: overdosed on Benadryl Rehab hx: denies  Exam Findings  Physical Exam: no abnormalities observed Vital Signs:  Temp:  [98 F (36.7 C)-98.2 F (36.8 C)] 98.2 F (36.8 C) (04/11 0659) Pulse Rate:  [75-129] 75 (04/11 0701) Resp:  [13-20] 16 (04/11 0701) BP: (97-134)/(60-99) 97/60 (04/11 0701) SpO2:  [97 %-100 %] 97 % (04/11 0701) Weight:  [48.5 kg] 48.5 kg (04/10 1619) Blood pressure 97/60, pulse 75, temperature 98.2 F (36.8 C), temperature source Axillary, resp. rate 16, height 5\' 7"  (1.702 m), weight 48.5 kg, last menstrual period 10/26/2023, SpO2 97%. Body mass index is 16.76 kg/m.  Physical Exam Vitals and nursing note reviewed.  Constitutional:  Appearance: Normal appearance.  Neurological:     General: No focal deficit present.     Mental Status: She is alert and oriented to person, place, and time.     Mental Status Exam: General Appearance: Disheveled  Orientation: Full orientation  Memory: Recent remote  immediate good  Concentration: Good  Recall: Good  Attention good  Eye Contact: Good  Speech: Clear and coherent  Language: Good  Volume: Normal  Mood: Depressed  Affect: Depressed  Thought Process: Goal directed  Thought Content: Illogical  Suicidal Thoughts:  Yes.  with intent/plan  Homicidal Thoughts: No  Judgement: Poor  Insight: Lacking  Psychomotor Activity: Normal  Akathisia: No  Fund of Knowledge: Good      Assets:  Communication Skills Desire for Improvement  Cognition:  WNL  ADL's:  Intact  AIMS (if indicated):        Other History   These have been pulled in through the EMR, reviewed, and updated if appropriate.  Family History:  The patient's family history includes Asthma in her maternal grandmother; Mitral valve prolapse in her mother.  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 11/2016 Echo: EF 55-60%, Mild MR w/ mild prolapse; b. 07/2019 Echo: EF 50-55%, no rwma. 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   Palpitations    Prolonged QT interval    PTSD (post-traumatic stress disorder)     Surgical History: Past Surgical History:  Procedure Laterality Date   HERNIA REPAIR  2005     Medications:   Current Facility-Administered Medications:    traZODone (DESYREL) tablet 50 mg, 50 mg, Oral, QHS, Sommer Spickard, NP   ulipristal acetate (ELLA) tablet 30 mg, 30 mg, Oral, Once, Alvester Morin, Isa Rankin, MD  Current Outpatient Medications:    diphenhydrAMINE (BENADRYL) 50 MG capsule, Take 50 mg by mouth every 6 (six) hours as needed for allergies, itching or sleep., Disp: , Rfl:   Allergies: No Known Allergies  Mcneil Sober, NP

## 2023-10-27 NOTE — ED Notes (Addendum)
 Pt taken to interview room for psych consult

## 2023-10-27 NOTE — ED Notes (Signed)
 Poison control updated, no new orders/closing case

## 2023-10-27 NOTE — BHH Group Notes (Signed)
 Adult Psychoeducational Group Note  Date:  10/27/2023 Time:  8:58 PM  Group Topic/Focus:  Wrap-Up Group:   The focus of this group is to help patients review their daily goal of treatment and discuss progress on daily workbooks.  Participation Level:  Minimal  Participation Quality:  Attentive  Affect:  Flat  Cognitive:  Appropriate  Insight: None  Engagement in Group:  None  Modes of Intervention:  Discussion  Additional Comments:  Patient attended the Wrap-up group.  Jearl Klinefelter 10/27/2023, 8:58 PM

## 2023-10-27 NOTE — ED Notes (Signed)
 Called pt dad and updated on pt plan of care for pt. Dad  verbalized understanding.

## 2023-10-27 NOTE — BH Assessment (Signed)
 Night TTS Sheila Camacho F.) requested [place] Consult via phone call and secure chat.

## 2023-10-27 NOTE — ED Notes (Signed)
[place]  [place]  DEPT  CALLED  FOR  TRANSPORT  TO [facility] ?

## 2023-10-28 DIAGNOSIS — D821 Di George's syndrome: Secondary | ICD-10-CM | POA: Diagnosis present

## 2023-10-28 DIAGNOSIS — F332 Major depressive disorder, recurrent severe without psychotic features: Secondary | ICD-10-CM | POA: Diagnosis not present

## 2023-10-28 DIAGNOSIS — F411 Generalized anxiety disorder: Secondary | ICD-10-CM | POA: Diagnosis present

## 2023-10-28 LAB — BASIC METABOLIC PANEL WITH GFR
Anion gap: 11 (ref 5–15)
BUN: 13 mg/dL (ref 6–20)
CO2: 23 mmol/L (ref 22–32)
Calcium: 9.5 mg/dL (ref 8.9–10.3)
Chloride: 102 mmol/L (ref 98–111)
Creatinine, Ser: 0.76 mg/dL (ref 0.44–1.00)
GFR, Estimated: >60 mL/min (ref >60)
Glucose, Bld: 143 mg/dL — ABNORMAL HIGH (ref 70–99)
Potassium: 4.3 mmol/L (ref 3.5–5.1)
Sodium: 136 mmol/L (ref 135–145)

## 2023-10-28 LAB — PREGNANCY, URINE: Preg Test, Ur: NEGATIVE

## 2023-10-28 MED ORDER — MELATONIN 3 MG PO TABS
3.0000 mg | ORAL_TABLET | Freq: Every day | ORAL | Status: DC
  Administered 2023-10-28 – 2023-10-31 (×4): 3 mg via ORAL
  Filled 2023-10-28 (×5): qty 1
  Filled 2023-10-28: qty 7

## 2023-10-28 MED ORDER — SERTRALINE HCL 50 MG PO TABS
50.0000 mg | ORAL_TABLET | Freq: Every day | ORAL | Status: DC
  Administered 2023-10-29 – 2023-10-30 (×2): 50 mg via ORAL
  Filled 2023-10-28 (×4): qty 1

## 2023-10-28 MED ORDER — HYDROXYZINE HCL 25 MG PO TABS
25.0000 mg | ORAL_TABLET | Freq: Three times a day (TID) | ORAL | Status: DC | PRN
  Administered 2023-10-28: 25 mg via ORAL
  Filled 2023-10-28 (×2): qty 1

## 2023-10-28 MED ORDER — SERTRALINE HCL 25 MG PO TABS
25.0000 mg | ORAL_TABLET | Freq: Once | ORAL | Status: AC
  Administered 2023-10-28: 25 mg via ORAL
  Filled 2023-10-28 (×2): qty 1

## 2023-10-28 NOTE — BHH Counselor (Signed)
 Adult Comprehensive Assessment  Patient ID: Laurielle Shalico Midgett, female   DOB: [birth year], 28 y.o.   MRN: [id number]  Information Source: Information source: Patient  Current Stressors:  Patient states their primary concerns and needs for treatment are:: The client share that she is here for suicide ideation Patient states their goals for this hospitilization and ongoing recovery are:: The patient share that she will medication management, the patient will attend all the groups that referred Educational / Learning stressors: none reported Employment / Job issues: none reported Family Relationships: The patient share that her aunt is a Metallurgist / Lack of resources (include bankruptcy): The patient share that she does not have the money to pay to being there Housing / Lack of housing: none reported Physical health (include injuries & life threatening diseases): none reported Social relationships: none reported Substance abuse: None reported Bereavement / Loss: The patient share that she lost her mother 2 years ago  Living/Environment/Situation:  Living Arrangements: Parent Living conditions (as described by patient or guardian): The patient share that she has "shit box for trailer", father Who else lives in the home?: The patient share that her sister who is a CNA How long has patient lived in current situation?: 2 years What is atmosphere in current home: Comfortable, Temporary  Family History:  Marital status: Single Are you sexually active?: Yes What is your sexual orientation?: Heterosexual Has your sexual activity been affected by drugs, alcohol, medication, or emotional stress?: none reported Does patient have children?: No  Childhood History:  By whom was/is the patient raised?: Both parents Additional childhood history information: The patient lost her 2 years Description of patient's relationship with caregiver when they were a child: The patient share that dad  is like a bestfriend Patient's description of current relationship with people who raised him/her: The patient share that she can depend on her dad How were you disciplined when you got in trouble as a child/adolescent?: The patient share that she get whooping and grounding Does patient have siblings?: Yes Number of Siblings: 1 Description of patient's current relationship with siblings: The patient share that they are best freind Did patient suffer any verbal/emotional/physical/sexual abuse as a child?: No Did patient suffer from severe childhood neglect?: No Has patient ever been sexually abused/assaulted/raped as an adolescent or adult?: No Was the patient ever a victim of a crime or a disaster?: No Witnessed domestic violence?: No Has patient been affected by domestic violence as an adult?: No  Education:  Highest grade of school patient has completed: 12 Currently a Consulting civil engineer?: No Learning disability?: No  Employment/Work Situation:   Employment Situation: Unemployed Patient's Job has Been Impacted by Current Illness: No What is the Longest Time Patient has Held a Job?: 6 months Where was the Patient Employed at that Time?: [employer] Has Patient ever Been in the [employer]?: No  Financial Resources:   Financial resources: No income Does patient have a Lawyer or guardian?: No  Alcohol/Substance Abuse:   What has been your use of drugs/alcohol within the last 12 months?: none reported If attempted suicide, did drugs/alcohol play a role in this?: No Alcohol/Substance Abuse Treatment Hx: Denies past history Has alcohol/substance abuse ever caused legal problems?: No  Social Support System:   Describe Community Support System: The patient share that she has grandmother's church Type of faith/religion: Web designer, church once in [place] How does patient's faith help to cope with current illness?: The patient stated that it helps when she  Leisure/Recreation:  Do You Have  Hobbies?: Yes Leisure and Hobbies: The patient likes to draw and walk  Strengths/Needs:   What is the patient's perception of their strengths?: The patient share that she workout and would like [employer] Patient states they can use these personal strengths during their treatment to contribute to their recovery: The patient share that it helps her makes script Patient states these barriers may affect/interfere with their treatment: The patient share that there is none to reported Patient states these barriers may affect their return to the community: NA Other important information patient would like considered in planning for their treatment: NA  Discharge Plan:   Currently receiving community mental health services: No Patient states concerns and preferences for aftercare planning are: none reported Patient states they will know when they are safe and ready for discharge when: Would like to be discharge on Monday and would prep for her therapy Does patient have access to transportation?: Yes Does patient have financial barriers related to discharge medications?: No Patient description of barriers related to discharge medications: The patient did not report any barriers for her discharge medication Will patient be returning to same living situation after discharge?: Yes  Summary/Recommendations:   Summary and Recommendations (to be completed by the evaluator): BETTY DAIDONE is a 28 year old Caucasian female. The patient presented herself to [facility] ED for overdose on Benadryl with an intent to harm. The patient was later transfer to [facility] for psychiatric evaluation and self-harm. The patient denies SI, HI and AVH at the current time. The patient stated that she lives with her father and her sister in a "shit like trailer." The patient stated that she sometimes watches her friend's children; "adopted children". The patient share that she would keep the friends' children until they feel  better. The patient shared that she works at [employer], and she has to run the line all by herself. The patient share "they don't pay me enough and I don't know what I am doing." The patient share that she does not have enough money to pay for hospital and would like to leave. Patient will benefit from crisis stabilization, medication evaluation, group therapy and psychoeducation, in addition to case management for discharge planning. At discharge it is recommended that Patient adhere to the established discharge plan and continue in treatment  Britni Driscoll O Kaliq Lege. 10/28/2023

## 2023-10-28 NOTE — Progress Notes (Signed)
 D: Patient is alert, oriented, and cooperative. Denies SI, HI, AVH, and verbally contracts for safety. Patient denies physical symptoms/pain. Patient reports anxiety.   EKG completed and urine sample collected.   A: Scheduled medications administered per MD order. PRN hydroxyzine administered. Support provided. Patient educated on safety on the unit and medications. Routine safety checks every 15 minutes. Patient stated understanding to tell nurse about any new physical symptoms. Patient understands to tell staff of any needs.     R: No adverse drug reactions noted. Patient remains safe at this time and will continue to monitor.    10/28/23 0800  Psych Admission Type (Psych Patients Only)  Admission Status Involuntary  Psychosocial Assessment  Patient Complaints Anxiety;Irritability  Eye Contact Brief  Facial Expression Anxious  Affect Anxious;Irritable  Speech Logical/coherent  Interaction Guarded;Minimal  Motor Activity Other (Comment) (WNL)  Appearance/Hygiene Disheveled  Behavior Characteristics Cooperative;Irritable  Mood Anxious;Irritable  Thought Process  Coherency WDL  Content WDL  Delusions None reported or observed  Perception WDL  Hallucination None reported or observed  Judgment Impaired  Confusion None  Danger to Self  Current suicidal ideation? Denies  Agreement Not to Harm Self Yes  Description of Agreement verbal  Danger to Others  Danger to Others None reported or observed

## 2023-10-28 NOTE — Plan of Care (Signed)

## 2023-10-28 NOTE — BHH Suicide Risk Assessment (Signed)
 Suicide Risk Assessment  Admission Assessment    [facility] Admission Suicide Risk Assessment   Nursing information obtained from:  Patient Demographic factors:  Caucasian Current Mental Status:  NA Loss Factors:  Loss of significant relationship (mother died by suicide 2 years ago) Historical Factors:  Family history of suicide Risk Reduction Factors:  Sense of responsibility to family, Living with another person, especially a relative  Total Time spent with patient: 1.5 hours Principal Problem: MDD (major depressive disorder), recurrent severe, without psychosis (HCC) Diagnosis:  Principal Problem:   MDD (major depressive disorder), recurrent severe, without psychosis (HCC) Active Problems:   GAD (generalized anxiety disorder)   DiGeorge's syndrome (HCC)  Subjective Data: Suicide attempt via overdose on OTC Benadryl   Continued Clinical Symptoms: Patient remains very depressed, minimizing symptoms, exhibiting poor insight regarding the overdose leading to this hospitalization, tearful through entire encounter, patient is currently deemed at a heightened risk of danger to self, also has a significant history of mental illness in her family as evidenced by mother's completed suicide in January 2023 as per her father.  Patient continuing to require inpatient hospitalization at this time for treatment and stabilization of her mental health symptoms.  Alcohol Use Disorder Identification Test Final Score (AUDIT): 0 The "Alcohol Use Disorders Identification Test", Guidelines for Use in Primary Care, Second Edition.  World Science writer [facility]). Score between 0-7:  no or low risk or alcohol related problems. Score between 8-15:  moderate risk of alcohol related problems. Score between 16-19:  high risk of alcohol related problems. Score 20 or above:  warrants further diagnostic evaluation for alcohol dependence and treatment.  CLINICAL FACTORS:   Depression:    Anhedonia Hopelessness Impulsivity Insomnia Recent sense of peace/wellbeing Severe More than one psychiatric diagnosis Unstable or Poor Therapeutic Relationship Previous Psychiatric Diagnoses and Treatments Medical Diagnoses and Treatments/Surgeries  Musculoskeletal: Strength & Muscle Tone: within normal limits Gait & Station: normal Patient leans: N/A  Psychiatric Specialty Exam:  Presentation  General Appearance:  Casual  Eye Contact: Fair  Speech: Clear and Coherent  Speech Volume: Normal  Handedness: Right   Mood and Affect  Mood: Depressed; Anxious  Affect: Congruent   Thought Process  Thought Processes: Coherent  Descriptions of Associations:Intact  Orientation:Full (Time, Place and Person)  Thought Content:Logical  History of Schizophrenia/Schizoaffective disorder:No data recorded Duration of Psychotic Symptoms:No data recorded Hallucinations:Hallucinations: None  Ideas of Reference:None  Suicidal Thoughts:Suicidal Thoughts: Yes, Passive  Homicidal Thoughts:Homicidal Thoughts: No   Sensorium  Memory: Recent Poor  Judgment: Poor  Insight: Poor   Executive Functions  Concentration: Poor  Attention Span:No data recorded Recall: Poor  Fund of Knowledge: Poor  Language: Fair   Psychomotor Activity  Psychomotor Activity: Psychomotor Activity: Normal   Assets  Assets: Resilience   Sleep  Sleep: Sleep: Poor    Physical Exam: Physical Exam Constitutional:      Appearance: Normal appearance.  Musculoskeletal:     Cervical back: Normal range of motion.  Neurological:     General: No focal deficit present.     Mental Status: She is alert.    Review of Systems  Psychiatric/Behavioral:  Positive for depression and suicidal ideas. Negative for hallucinations, memory loss and substance abuse. The patient is nervous/anxious and has insomnia.   All other systems reviewed and are negative.  Blood pressure (!)  93/58, pulse 88, temperature (!) 97.3 F (36.3 C), temperature source Oral, resp. rate 18, height 5\' 6"  (1.676 m), weight 46.7 kg, last menstrual period 10/26/2023,  SpO2 99%. Body mass index is 16.62 kg/m.   COGNITIVE FEATURES THAT CONTRIBUTE TO RISK:  None    SUICIDE RISK:   Severe:  Frequent, intense, and enduring suicidal ideation, specific plan, no subjective intent, but some objective markers of intent (i.e., choice of lethal method), the method is accessible, some limited preparatory behavior, evidence of impaired self-control, severe dysphoria/symptomatology, multiple risk factors present, and few if any protective factors, particularly a lack of social support.  PLAN OF CARE: See H & P  I certify that inpatient services furnished can reasonably be expected to improve the patient's condition.   Robet Chiquito, NP 10/28/2023, 3:41 PM

## 2023-10-28 NOTE — Group Note (Signed)
 Date:  10/28/2023 Time:  8:55 PM  Group Topic/Focus:  Wrap-Up Group:   The focus of this group is to help patients review their daily goal of treatment and discuss progress on daily workbooks.    Participation Level:  Active  Participation Quality:  Appropriate and Attentive  Affect:  Appropriate  Cognitive:  Appropriate  Insight: Appropriate and Good  Engagement in Group:  Engaged  Modes of Intervention:  Discussion  Additional Comments:  Pt initially had a bad day, but she turned it around/  [employer] 10/28/2023, 8:55 PM

## 2023-10-28 NOTE — Plan of Care (Signed)
   Problem: Safety: Goal: Periods of time without injury will increase Outcome: Progressing

## 2023-10-28 NOTE — H&P (Addendum)
 Psychiatric Admission Assessment Adult  Patient Identification: Sheila Camacho MRN:  [id number] Date of Evaluation:  10/28/2023 Chief Complaint:  MDD (major depressive disorder) [F32.9] Principal Diagnosis: MDD (major depressive disorder), recurrent severe, without psychosis (HCC) Diagnosis:  Principal Problem:   MDD (major depressive disorder), recurrent severe, without psychosis (HCC) Active Problems:   GAD (generalized anxiety disorder)   DiGeorge's syndrome (HCC)  HPI: Sheila Camacho is a 28 y.o. Caucasian female with prior mental health diagnoses of MDD & ADHD & a medical history of QT prolongation & DiGeorge syndrome who presented initially to the [facility] 4/10 accompanied by her sister after an overdose on Benadryl with an intent to end her life.  As per ER documentation, patient took "8 50 mg Raxell Benadryl sleep aides". Patient was treated and medically cleared prior to being transferred to this [facility] under IVC status for treatment & stabilization of her mental status.  During encounter with patient, she persistently minimizes her depressive symptoms, and the overdose leading to this admission.  She overly focuses on wanting to be discharged, exhibits poor insight, purposefully withholding some of her mental health history due to wanting to be discharged.  With patient's consent, provided verbally, writer was also able to reach out to patient's father with whom she resides (Sheila Camacho), and get some background information related to her mental health as well as past medical history.    Stressors:  Both patient and her father shared that psychosocial stressors were a huge contributor to her declining mental health; both stated that patient did not currently have a job, and was suffering financially.  Both stated that patient has had difficulty obtaining a job, with pt adding that her last place of work was at [employer], reports being tired of working at [employer], and wanting to branch out and work elsewhere. States that the last time she worked was a few months ago.   Patient's father reports that himself & pt reside in the same household, and both contribute amount of rents to the place that they reside at.  Patient's father states that patient is severely behind in her rent, and has not paid rent in several months, and in the days preceding her overdose, she had an argument with the landlord, which rendered her feeling very sad and depressed.  Patient shares that "I was not thinking straight", declines to state what was undermining when she took the overdose of the medications.  Patient mentions the death of her mother as another stressor, states that her mother passed away on August 23, 2025then perseverates about her birthday being [birth year], making mention of the fact that her aunt kept her away from her mother. Father states that pt's mother passed away in September 03, 2021.   Pt reports that another stressor is her paganism, and states that the rest of her family are Christians. States that they do not understand her worshipping gods and goddesses, but then, adds, "I still go to church when I feel like I need it". Pt seems conflicted regarding her religion, talks about worshipping "Lochi", having praying beads, etc. States that she does not have her own transportation which is also a stressor.  Mental health history:  Father reports a prior attempt when pt was in middle school & was being bullied, states that she required hospitalization then, unable to recall name of hospital. Pt reports psychotropic med trials of Lamictal & Trazodone, states that it has been >2 yrs since she  took a psychotropic medication, but is now open to trials. Denies having a current therapist or mental health provider for med management.    Review of Symptoms Depression: Patient again minimizing depressive symptoms, denies symptoms consistent with depression and reports of wanting to  be discharged.  Overdose leading to this hospitalization is evidence that she is deceptive.  Her father reports that patient does not sleep, and he is not sure why.  He states that this has been an ongoing issue for at least the past year.  He reports that patient has lost interest in doing things that typically make her happy such as walking.  He reports that patient's concentration is off, & describes symptoms which seem to be consistent with psychomotor retardation; describes mental clouding as a frequent complaint by pt, trouble getting her mind to coordinate with her, physical body to do things that positively impact her, report a decreased appetite in pt, state that she has lost a Lot of interest in food and no longer eats well. Father states that he stated noticing these symptoms when pt's mother passed away in 2023/02/08worsening a month prior to the overdose leading to this hospitalization.  SIB: Denies any history of self-injurious behaviors.  Emotional, physical or sexual trauma: Patient denies all, but father reports a history of being bullied in middle school where patient will come home crying, states that this is what triggered her first overdose which landed her first mental health related admission.   PTSD: Denies   Mania: Denies hypomania or mania symptoms consistent with a diagnosis of bipolar disorder.  OCD: Denies symptoms consistent with a diagnosis of OCD  Anxiety: Patient reports worrying, feeling tense, feeling on edge all the time, with muscle tension, for at least the past 1 year, worsening in the days leading to this hospitalization.  Patient denies any symptoms significant for panic disorder.  Social Phobias: Denies.  Eating Disorders: Patient denies any symptoms significant for eating disorders.  Psychosis: Specifically denies any history of auditory, visual, tactile hallucinations in the past, recently, or currently.  Also denies paranoia, delusional thinking, and  denies first rank symptoms.  Patient presents with no overt signs of psychosis.  Personality Disorders: Patient denies symptoms significant for personality disorders.  Special phobias: Denies.  Substance use:  Alcohol: Denies. Marijuana: Denies marijuana use, but patient's father states that patient has a history of marijuana use in high school sporadically.  He reports that to the best of his knowledge, patient has not used this substance in the past 3 years. Cocaine: Denies Nicotine: Nicotine only when stressed ecstasy, prescription med abuse: Denies  Patient denies any other substance is not mentioned above.   Medical history: Med conditions consist of QT prolongation which patient's brother states that he was being followed by [facility] cardiology, but they have since been told that this is no longer necessary.  He also reports DiGeorge syndrome, denies medication or food allergies  patient denies that she is currently on any medications, denies ever having any surgeries, and is currently on her menstrual period.   Social history: Reports a good support system in her father with whom she currently resides.  Reports highest level of education is high school, currently does not have a job does not have any income,religion is Paganism, hobbies are walking, watching wrestling mania, drawing, coloring, sexual ID straight, Gender ID is female, Born/raised in [place], [place].   Family history: Patient's father report a significant history of mental illness in  patient's mother's family, including a completed suicide by patient's mother via overdose on prescription medications in January 2023.  He denies any substance abuse in pt's family.  Writer repeatedly asked, but patient was not forthcoming regarding how her mother passed away.  Military hx, Legal probs, Violence hx: Denies    Patient's current presentation: Overall demeanor is child-like; Pt presents with a flat affect & depressed  mood, attention to personal hygiene and grooming is poor, eye contact is fair, speech is clear & coherent. Thought contents are organized and logical, by patient is severely lacking in insight & judgment. Writer had to repeat facts multiple times in order for pt to comprehend them, seemed to want immediate gratification at times, unable to comprehend the severiry of the overdose leading to this hospitalization & that it might have led to potential for fatality  She is verbally contracting for safety on the unit at this time, but remains at a heightened risk of suicide, & case discussed with ZOUEV, who agrees with above and is upholding IVC at this time.   Associated Signs/Symptoms: Depression Symptoms:  depressed mood, anhedonia, insomnia, fatigue, feelings of worthlessness/guilt, difficulty concentrating, hopelessness, impaired memory, suicidal attempt, anxiety, loss of energy/fatigue, disturbed sleep, decreased appetite, (Hypo) Manic Symptoms:  Distractibility, Impulsivity, Irritable Mood, Labiality of Mood, Anxiety Symptoms:  Excessive Worry, Psychotic Symptoms:   n/a PTSD Symptoms: NA Total Time spent with patient: 1.5 hours Is the patient at risk to self? Yes.    Has the patient been a risk to self in the past 6 months? Yes.    Has the patient been a risk to self within the distant past? Yes.    Is the patient a risk to others? No.  Has the patient been a risk to others in the past 6 months? No.  Has the patient been a risk to others within the distant past? No.   [place] Scale:  Flowsheet Row [facility] ([facility]) [facility] from 10[facility]26/2023 in [facility] Department at [facility] ED from 03/02/2023 in [facility] Department at [facility]  C-SSRS RISK CATEGORY High Risk High Risk No Risk       Alcohol Screening: 1. How often do you have a drink containing alcohol?: Never 2. How many  drinks containing alcohol do you have on a typical day when you are drinking?: 1 or 2 3. How often do you have six or more drinks on one occasion?: Never AUDIT-C Score: 0 4. How often during the last year have you found that you were not able to stop drinking once you had started?: Never 5. How often during the last year have you failed to do what was normally expected from you because of drinking?: Never 6. How often during the last year have you needed a first drink in the morning to get yourself going after a heavy drinking session?: Never 7. How often during the last year have you had a feeling of guilt of remorse after drinking?: Never 8. How often during the last year have you been unable to remember what happened the night before because you had been drinking?: Never 9. Have you or someone else been injured as a result of your drinking?: No 10. Has a relative or friend or a doctor or another health worker been concerned about your drinking or suggested you cut down?: No Alcohol Use Disorder Identification Test Final Score (AUDIT): 0 Alcohol Brief Interventions/Follow-up: Alcohol education/Brief advice Substance  Abuse History in the last 12 months:  No. 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 11/2016 Echo: EF 55-60%, Mild MR w/ mild prolapse; b. 07/2019 Echo: EF 50-55%, no rwma. 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   Palpitations    Prolonged QT interval    PTSD (post-traumatic stress disorder)     Past Surgical History:  Procedure Laterality Date   HERNIA REPAIR  2005   Family History:  Family History  Problem Relation Age of Onset   Mitral valve  prolapse Mother    Asthma Maternal Grandmother    Cancer Neg Hx    Family Psychiatric  History: See above  Tobacco Screening:  Social History   Tobacco Use  Smoking Status Former  Smokeless Tobacco Never    BH Tobacco Counseling     Are you interested in Tobacco Cessation Medications?  N/A, patient does not use tobacco products Counseled patient on smoking cessation:  No value filed. Reason Tobacco Screening Not Completed: No value filed.       Social History:  Social History   Substance and Sexual Activity  Alcohol Use No   Alcohol/week: 0.0 standard drinks of alcohol     Social History   Substance and Sexual Activity  Drug Use No    Additional Social History: Marital status: Single Are you sexually active?: Yes What is your sexual orientation?: Heterosexual Has your sexual activity been affected by drugs, alcohol, medication, or emotional stress?: none reported Does patient have children?: No    Allergies:   Allergies  Allergen Reactions   Garcinia Mangostana Extract [Garcinia Cambogia] Swelling    Pt allergic to the fruit mango   Lab Results:  Results for orders placed or performed during the hospital encounter of 10/26/23 (from the past 48 hours)  Magnesium     Status: None   Collection Time: 10/26/23  4:21 PM  Result Value Ref Range   Magnesium 2.1 1.7 - 2.4 mg/dL    Comment: Performed at [facility], [address] [place], [place] [address]  Comprehensive metabolic panel     Status: Abnormal   Collection Time: 10/26/23  4:22 PM  Result Value Ref Range   Sodium 137 135 - 145 mmol/L   Potassium 3.1 (L) 3.5 - 5.1 mmol/L   Chloride 106 98 - 111 mmol/L   CO2 20 (L) 22 - 32 mmol/L   Glucose, Bld 77 70 - 99 mg/dL    Comment: Glucose reference range applies only to samples taken after fasting for at least 8 hours.   BUN 7 6 - 20 mg/dL   Creatinine, Ser 6.04 0.44 - 1.00 mg/dL   Calcium 9.0 8.9 - 54.0 mg/dL   Total Protein 8.2 (H) 6.5 - 8.1  g/dL   Albumin 4.6 3.5 - 5.0 g/dL   AST 18 15 - 41 U/L   ALT 11 0 - 44 U/L   Alkaline Phosphatase 67 38 - 126 U/L   Total Bilirubin 0.9 0.0 - 1.2 mg/dL   GFR, Estimated >98 >11 mL/min    Comment: (NOTE) Calculated using the CKD-EPI Creatinine Equation (2021)    Anion  gap 11 5 - 15    Comment: Performed at [facility], [address] [place], [place] [address]  Ethanol     Status: None   Collection Time: 10/26/23  4:22 PM  Result Value Ref Range   Alcohol, Ethyl (B) <10 <10 mg/dL    Comment: (NOTE) Lowest detectable limit for serum alcohol is 10 mg/dL.  For medical purposes only. Performed at [facility], [address] [place], [place] [address]   Salicylate level     Status: Abnormal   Collection Time: 10/26/23  4:22 PM  Result Value Ref Range   Salicylate Lvl <7.0 (L) 7.0 - 30.0 mg/dL    Comment: Performed at [facility], [address] [place], [place] [address]  Acetaminophen level     Status: Abnormal   Collection Time: 10/26/23  4:22 PM  Result Value Ref Range   Acetaminophen (Tylenol), Serum <10 (L) 10 - 30 ug/mL    Comment: (NOTE) Therapeutic concentrations vary significantly. A range of 10-30 ug/mL  may be an effective concentration for many patients. However, some  are best treated at concentrations outside of this range. Acetaminophen concentrations >150 ug/mL at 4 hours after ingestion  and >50 ug/mL at 12 hours after ingestion are often associated with  toxic reactions.  Performed at [facility], [address] [place], [place] [address]   cbc     Status: None   Collection Time: 10/26/23  4:22 PM  Result Value Ref Range   WBC 8.4 4.0 - 10.5 K/uL   RBC 4.60 3.87 - 5.11 MIL/uL   Hemoglobin 13.7 12.0 - 15.0 g/dL   HCT 29.5 62.1 - 30.8 %   MCV 89.8 80.0 - 100.0 fL   MCH 29.8 26.0 - 34.0 pg   MCHC 33.2 30.0 - 36.0 g/dL   RDW 65.7 84.6 - 96.2 %   Platelets 274 150 - 400 K/uL   nRBC 0.0 0.0 - 0.2 %     Comment: Performed at [facility], [address] [place], [place] [address]  Acetaminophen level     Status: Abnormal   Collection Time: 10/26/23  8:02 PM  Result Value Ref Range   Acetaminophen (Tylenol), Serum <10 (L) 10 - 30 ug/mL    Comment: (NOTE) Therapeutic concentrations vary significantly. A range of 10-30 ug/mL  may be an effective concentration for many patients. However, some  are best treated at concentrations outside of this range. Acetaminophen concentrations >150 ug/mL at 4 hours after ingestion  and >50 ug/mL at 12 hours after ingestion are often associated with  toxic reactions.  Performed at [facility], [address] [place], [place] [address]     Blood Alcohol level:  Lab Results  Component Value Date   [facility] <10 10/26/2023    Metabolic Disorder Labs:  No results found for: "HGBA1C", "MPG" No results found for: "PROLACTIN" Lab Results  Component Value Date   CHOL (H) 09/10/2010    185        ATP III CLASSIFICATION:  <200     mg/dL   Desirable  010-272  mg/dL   Borderline High  >=536    mg/dL   High          TRIG 85 09/10/2010   HDL 56 09/10/2010   CHOLHDL 3.3 09/10/2010   VLDL 17 09/10/2010   LDLCALC (H) 09/10/2010    112        Total Cholesterol/HDL:CHD Risk Coronary Heart Disease Risk  Table                     Men   Women  1/2 Average Risk   3.4   3.3  Average Risk       5.0   4.4  2 X Average Risk   9.6   7.1  3 X Average Risk  23.4   11.0        Use the calculated Patient Ratio above and the CHD Risk Table to determine the patient's CHD Risk.        ATP III CLASSIFICATION (LDL):  <100     mg/dL   Optimal  454-098  mg/dL   Near or Above                    Optimal  130-159  mg/dL   Borderline  119-147  mg/dL   High  >829     mg/dL   Very High    Current Medications: Current Facility-Administered Medications  Medication Dose Route Frequency Provider Last Rate Last Admin   acetaminophen (TYLENOL) tablet 650  mg  650 mg Oral Q6H PRN Penn, Cicely, NP       alum & mag hydroxide-simeth (MAALOX/MYLANTA) 200-200-20 MG/5ML suspension 30 mL  30 mL Oral Q4H PRN Penn, Cicely, NP       haloperidol (HALDOL) tablet 5 mg  5 mg Oral TID PRN Monroe Antigua, NP       And   diphenhydrAMINE (BENADRYL) capsule 50 mg  50 mg Oral TID PRN Monroe Antigua, NP       haloperidol lactate (HALDOL) injection 10 mg  10 mg Intramuscular TID PRN Penn, Alaine Howells, NP       And   diphenhydrAMINE (BENADRYL) injection 50 mg  50 mg Intramuscular TID PRN Penn, Alaine Howells, NP       And   LORazepam (ATIVAN) injection 2 mg  2 mg Intramuscular TID PRN Penn, Alaine Howells, NP       haloperidol lactate (HALDOL) injection 5 mg  5 mg Intramuscular TID PRN Penn, Alaine Howells, NP       And   diphenhydrAMINE (BENADRYL) injection 50 mg  50 mg Intramuscular TID PRN Penn, Alaine Howells, NP       And   LORazepam (ATIVAN) injection 2 mg  2 mg Intramuscular TID PRN Monroe Antigua, NP       hydrOXYzine (ATARAX) tablet 25 mg  25 mg Oral TID PRN Robet Chiquito, NP   25 mg at 10/28/23 1320   magnesium hydroxide (MILK OF MAGNESIA) suspension 30 mL  30 mL Oral Daily PRN Penn, Alaine Howells, NP       melatonin tablet 3 mg  3 mg Oral QHS Zouev, Dmitri, MD       [START ON 10/29/2023] sertraline (ZOLOFT) tablet 50 mg  50 mg Oral Daily Zouev, Dmitri, MD       PTA Medications: Facility-Administered Medications Prior to Admission  Medication Dose Route Frequency Provider Last Rate Last Admin   ulipristal acetate (ELLA) tablet 30 mg  30 mg Oral Once Abner Ables, MD       Medications Prior to Admission  Medication Sig Dispense Refill Last Dose/Taking   diphenhydrAMINE (BENADRYL) 50 MG capsule Take 50 mg by mouth every 6 (six) hours as needed for allergies, itching or sleep.      Musculoskeletal: Strength & Muscle Tone: within normal limits Gait & Station: normal Patient leans: N/A  Psychiatric Specialty Exam:  Presentation  General Appearance:  Casual  Eye  Contact:Fair  Speech:Clear and Coherent  Speech Volume:Normal  Handedness:Right   Mood and Affect  Mood:Depressed; Anxious  Affect:Congruent   Thought Process  Thought Processes:Coherent  Duration of Psychotic Symptoms: >2 weeks  Past Diagnosis of Schizophrenia or Psychoactive disorder: No data recorded Descriptions of Associations:Intact  Orientation:Full (Time, Place and Person)  Thought Content:Logical  Hallucinations:Hallucinations: None  Ideas of Reference:None  Suicidal Thoughts:Suicidal Thoughts: Yes, Passive  Homicidal Thoughts:Homicidal Thoughts: No   Sensorium  Memory:Recent Poor  Judgment:Poor  Insight:Poor   Executive Functions  Concentration:Poor  Attention Span:No data recorded Recall:Poor  Fund of Knowledge:Poor  Language:Fair   Psychomotor Activity  Psychomotor Activity:Psychomotor Activity: Normal   Assets  Assets:Resilience   Sleep  Sleep:Sleep: Poor    Physical Exam: Physical Exam Vitals and nursing note reviewed.  HENT:     Head: Normocephalic.  Eyes:     Pupils: Pupils are equal, round, and reactive to light.  Musculoskeletal:     Cervical back: Normal range of motion.  Neurological:     General: No focal deficit present.     Mental Status: She is oriented to person, place, and time.    Review of Systems  Psychiatric/Behavioral:  Positive for depression and suicidal ideas. Negative for hallucinations, memory loss and substance abuse. The patient is nervous/anxious and has insomnia.   All other systems reviewed and are negative.  Blood pressure (!) 93/58, pulse 88, temperature (!) 97.3 F (36.3 C), temperature source Oral, resp. rate 18, height 5\' 6"  (1.676 m), weight 46.7 kg, last menstrual period 10/26/2023, SpO2 99%. Body mass index is 16.62 kg/m.  Treatment Plan Summary: Daily contact with patient to assess and evaluate symptoms and progress in treatment and Medication management  Safety and  Monitoring: Voluntary admission to inpatient psychiatric unit for safety, stabilization and treatment Daily contact with patient to assess and evaluate symptoms and progress in treatment Patient's case to be discussed in multi-disciplinary team meeting Observation Level : q15 minute checks Vital signs: q12 hours Precautions: Safety  Long Term Goal(s): Improvement in symptoms so as ready for discharge  Short Term Goals: Ability to identify changes in lifestyle to reduce recurrence of condition will improve, Ability to verbalize feelings will improve, Ability to disclose and discuss suicidal ideas, Ability to demonstrate self-control will improve, Ability to identify and develop effective coping behaviors will improve, Ability to maintain clinical measurements within normal limits will improve, Compliance with prescribed medications will improve, and Ability to identify triggers associated with substance abuse/mental health issues will improve  Diagnoses Principal Problem:   MDD (major depressive disorder), recurrent severe, without psychosis (HCC) Active Problems:   GAD (generalized anxiety disorder)   DiGeorge's syndrome (HCC)  Medications -Start Zoloft 25 mg x1 dose today, increase to 50 mg on 4/13 for MDD & GAD -Start Melatonin 3 mg nightly for sleep -Start nicotine patch 14 mg every 24 hours for nicotine use disorder.   PRNS -Continue hydroxyzine 25 mg 3 times daily as needed for anxiety -Continue agitation protocol medications: Reduce doses to 25 mg, 1 mg, and 5 mg for Benadryl, Ativan, and Haldol (previously 50 mg, 2 mg, and 10 mg respectively p.o. or IM 3 times daily as needed for agitation). -Continue Tylenol 650 mg every 6 hours PRN for mild pain -Continue Maalox 30 mg every 4 hrs PRN for indigestion -Continue Milk of Magnesia as needed every 6 hrs for constipation  Labs: Repeat BMP ordered for tonight, due to the a potassium level of 3.12 days  ago.  Ordered TSH, lipid panel,  hemoglobin A1c, vitamin D, B12 to rule out medical etiology for depressive symptoms. ordered EKG as well as urinalysis and pregnancy urine test.  Discharge Planning: Social work and case management to assist with discharge planning and identification of hospital follow-up needs prior to discharge Estimated LOS: 5-7 days Discharge Concerns: Need to establish a safety plan; Medication compliance and effectiveness Discharge Goals: Return home with outpatient referrals for mental health follow-up including medication management/psychotherapy  I certify that inpatient services furnished can reasonably be expected to improve the patient's condition.    Robet Chiquito, NP 4/12/20253:44 PM

## 2023-10-28 NOTE — Group Note (Signed)
 Date:  10/28/2023 Time:  9:40 AM  Group Topic/Focus:  Overcoming Stress:   The focus of this group is to define stress and help patients assess their triggers.    Participation Level:  Did Not Attend  Sheila Camacho 10/28/2023, 9:40 AM

## 2023-10-28 NOTE — Group Note (Signed)
 Date:  10/28/2023 Time:  2:07 PM  Group Topic/Focus:  Wellness Toolbox:   The focus of this group is to discuss various aspects of wellness, balancing those aspects and exploring ways to increase the ability to experience wellness.  Patients will create a wellness toolbox for use upon discharge.    Participation Level:  Active  Participation Quality:  Appropriate  Affect:  Appropriate  Sheila Camacho 10/28/2023, 2:07 PM

## 2023-10-29 DIAGNOSIS — F332 Major depressive disorder, recurrent severe without psychotic features: Secondary | ICD-10-CM | POA: Diagnosis not present

## 2023-10-29 LAB — URINALYSIS, COMPLETE (UACMP) WITH MICROSCOPIC
Bacteria, UA: NONE SEEN
Bilirubin Urine: NEGATIVE
Glucose, UA: NEGATIVE mg/dL
Ketones, ur: NEGATIVE mg/dL
Nitrite: NEGATIVE
Protein, ur: NEGATIVE mg/dL
Specific Gravity, Urine: 1.009 (ref 1.005–1.030)
pH: 5 (ref 5.0–8.0)

## 2023-10-29 LAB — VITAMIN B12: Vitamin B-12: 109 pg/mL — ABNORMAL LOW (ref 180–914)

## 2023-10-29 LAB — LIPID PANEL
Cholesterol: 203 mg/dL — ABNORMAL HIGH (ref 0–200)
HDL: 52 mg/dL (ref >40)
LDL Cholesterol: 134 mg/dL — ABNORMAL HIGH (ref 0–99)
Total CHOL/HDL Ratio: 3.9 ratio
Triglycerides: 83 mg/dL (ref <150)
VLDL: 17 mg/dL (ref 0–40)

## 2023-10-29 LAB — TSH: TSH: 1.06 u[IU]/mL (ref 0.350–4.500)

## 2023-10-29 LAB — VITAMIN D 25 HYDROXY (VIT D DEFICIENCY, FRACTURES): Vit D, 25-Hydroxy: 28.97 ng/mL — ABNORMAL LOW (ref 30–100)

## 2023-10-29 MED ORDER — VITAMIN B-12 1000 MCG PO TABS
1000.0000 ug | ORAL_TABLET | Freq: Every day | ORAL | Status: DC
  Administered 2023-10-29 – 2023-11-01 (×4): 1000 ug via ORAL
  Filled 2023-10-29 (×2): qty 1
  Filled 2023-10-29: qty 7
  Filled 2023-10-29 (×5): qty 1

## 2023-10-29 MED ORDER — VITAMIN D (ERGOCALCIFEROL) 1.25 MG (50000 UNIT) PO CAPS
50000.0000 [IU] | ORAL_CAPSULE | ORAL | Status: DC
  Administered 2023-10-29: 50000 [IU] via ORAL
  Filled 2023-10-29 (×2): qty 1

## 2023-10-29 MED ORDER — PHENAZOPYRIDINE HCL 100 MG PO TABS
100.0000 mg | ORAL_TABLET | Freq: Two times a day (BID) | ORAL | Status: AC
  Administered 2023-10-29 – 2023-10-31 (×4): 100 mg via ORAL
  Filled 2023-10-29 (×8): qty 1

## 2023-10-29 NOTE — [facility] Group Notes (Signed)
 [facility] Group Notes:  (Nursing/MHT/Case Management/Adjunct)  Date:  10/29/2023  Time:  9:37 PM  Type of Therapy:   Wrap-up group  Participation Level:  Active  Participation Quality:  Appropriate  Affect:  Appropriate  Cognitive:  Appropriate  Insight:  Appropriate  Engagement in Group:  Distracting  Modes of Intervention:  Education  Summary of Progress/Problems: Goal to call family. Met goal. Rated her day 10/10.   Sheila Camacho 10/29/2023, 9:37 PM

## 2023-10-29 NOTE — Plan of Care (Signed)
  Problem: Coping: Goal: Ability to verbalize frustrations and anger appropriately will improve Outcome: Progressing   Problem: Activity: Goal: Interest or engagement in activities will improve Outcome: Progressing   Problem: Safety: Goal: Periods of time without injury will increase Outcome: Progressing

## 2023-10-29 NOTE — Progress Notes (Signed)
   10/29/23 2030  Psych Admission Type (Psych Patients Only)  Admission Status Involuntary  Psychosocial Assessment  Patient Complaints Anxiety  Eye Contact Fair  Facial Expression Worried  Affect Anxious  Speech Logical/coherent  Interaction Assertive  Motor Activity Fidgety  Appearance/Hygiene Disheveled  Behavior Characteristics Cooperative  Mood Preoccupied;Pleasant  Aggressive Behavior  Effect No apparent injury  Thought Process  Coherency WDL  Content WDL  Delusions WDL  Perception WDL  Hallucination None reported or observed  Judgment WDL  Confusion WDL  Danger to Self  Current suicidal ideation? Denies  Danger to Others  Danger to Others None reported or observed

## 2023-10-29 NOTE — Progress Notes (Signed)
   10/29/23 0800  Psych Admission Type (Psych Patients Only)  Admission Status Involuntary  Psychosocial Assessment  Patient Complaints Anxiety  Eye Contact Fair  Facial Expression Worried  Affect Anxious  Speech Logical/coherent  Interaction Assertive  Motor Activity Fidgety  Appearance/Hygiene Improved  Behavior Characteristics Cooperative;Appropriate to situation  Mood Pleasant  Thought Process  Coherency WDL  Content WDL  Delusions WDL  Perception WDL  Hallucination None reported or observed  Judgment Poor  Confusion WDL  Danger to Self  Current suicidal ideation? Denies  Self-Injurious Behavior No self-injurious ideation or behavior indicators observed or expressed   Agreement Not to Harm Self Yes  Description of Agreement Verbal  Danger to Others  Danger to Others None reported or observed

## 2023-10-29 NOTE — Plan of Care (Signed)
   Problem: Education: Goal: Knowledge of [facility] Education information/materials will improve Outcome: Progressing Goal: Emotional status will improve Outcome: Progressing Goal: Mental status will improve Outcome: Progressing Goal: Verbalization of understanding the information provided will improve Outcome: Progressing

## 2023-10-29 NOTE — Group Note (Signed)
 [facility] LCSW Group Therapy Note  @TD @  1:00-2:00  Type of Therapy and Topic:  Group Therapy:  Taking Good Care of Yourself  Participation Level:  Active   Description of Group:  Patients in this group were introduced to the idea of adding a variety of healthy supports to address the various needs in their lives.  Different types of support were defined and described, and each type was acted out.  Patients discussed what additional healthy supports could be helpful in their recovery and wellness after discharge in order to prevent future hospitalizations.   An emphasis was placed on following up with the discharge plan when they leave the hospital in order to continue becoming healthier and happier.    Therapeutic Goals: 1)  demonstrate the importance of self-care  2)  What the patient dose to take care of themself  3)  Identify the benefit of selfcare  4)  Understand that asking for help is okay...   Summary of Patient Progress:  The patient expressed her comprehension of the concepts presented, and express that there is a need to add more supports.  Current supports include taking care of herself.  The patient indicated one healthy support that could be helpful if added would be having enough resources that can help her.  She participated by understanding and expressing her opinion about not having the enough staffs to validate her needs.  Therapeutic Modalities:   Psychoeducation Brief Solution-Focused Therapy

## 2023-10-29 NOTE — Progress Notes (Signed)
   10/28/23 2100  Psych Admission Type (Psych Patients Only)  Admission Status Involuntary  Psychosocial Assessment  Patient Complaints Anxiety;Other (Comment) (Pt is perseverating on discharge and fixated on questioning staff ways she can insure it. Pt stated doctor said in 72 hours. Pt stated she talked to her sister for support and learning about meditation to cope in group. Pt endorsed financial stressors.)  Eye Contact Brief  Facial Expression Worried;Other (Comment);Anxious (Intense)  Affect Anxious  Speech Logical/coherent;Other (Comment) (Pt is ixated on discharging. Oriented pt the preocess on working with the treatment team to determine if able to discharge. She verbalized understanding but continued to fixate on discharge.)  Interaction Guarded;Assertive  Motor Activity Restless  Appearance/Hygiene Designer, industrial/product Cooperative;Anxious;Guarded  Mood Anxious;Apprehensive  Danger to Self  Current suicidal ideation? Denies  Self-Injurious Behavior No self-injurious ideation or behavior indicators observed or expressed   Agreement Not to Harm Self Yes  Description of Agreement Verbal  Danger to Others  Danger to Others None reported or observed

## 2023-10-29 NOTE — Progress Notes (Signed)
 [facility] MD Progress Note  10/29/2023 3:11 PM Sheila Camacho  MRN:  [id number] Principal Problem: MDD (major depressive disorder), recurrent severe, without psychosis (HCC) Diagnosis: Principal Problem:   MDD (major depressive disorder), recurrent severe, without psychosis (HCC) Active Problems:   GAD (generalized anxiety disorder)   DiGeorge's syndrome (HCC)  HPI: Sheila Camacho is a 28 y.o. Caucasian female with prior mental health diagnoses of MDD & ADHD & a medical history of QT prolongation & DiGeorge syndrome who presented initially to the [facility] ER 4/10 accompanied by her sister after an overdose on Benadryl with an intent to end her life.  As per ER documentation, patient took "8 50 mg Raxell Benadryl sleep aides". Patient was treated and medically cleared prior to being transferred to this [facility] under IVC status for treatment & stabilization of her mental status.   24 hr chart review: Sleep Hours last night: 8 hrs as per nursing, flowsheets. Nursing Concerns: perseveration about discharge  Behavioral episodes in the past 24 hrs: As above Medication Compliance: compliant, taking scheduled Zoloft. Vital Signs in the past 24 hrs: low SBP in the 90s, low DBPs in the 50s PRN Medications in the past 24 hrs: Hydroxyzine last given yesterday afternoon.  Today's patient assessment note: On assessment today, the pt reports that their mood is less depressed as compared to yesterday, but again, fixated on wanting to know if she will be leaving tomorrow, or on Tuesday. Educated that there is not a discharge date for her yet, and educated on the need to abstain from focusing on discharge and t/o focus on getting better mentally. Reports that anxiety is also less than yesterday, but objectively, seems very anxious, as she is pacing the 400 hall restlessly.  Sleep is "good" per patient. Appetite is "fair, and I am trying to eat as much as I can". Concentration is poor  per objective assessment.  Energy level is moderate per pt's reports. Denies suicidal thoughts. Denies suicidal intent and plan. Seems however, to continue to be minimizing, her depressive symptoms, seems to be underreporting. Objectively, seems very depressed.  Denies having any HI.  Denies having psychotic symptoms.   Denies having side effects to current psychiatric medications. Patient is being encouraged to push fluids. BP is low, but she is asymptomatic, denies lightheadedness, denies dizziness.   We discussed continuing current medication regimen with no changes today. Zoloft was increased today from 25 mg to 50 mg. Will allow a few more days on this medication, and will increase to 100 mg if she continues to tolerate medication.  Discussed the following psychosocial stressors: wanting to be discharged & financial stressors. Active listening and empathy provided. Pt states that her father visited last night, it went well. She shares that she plans on continuing, her search for a job once discharged. Patient is continuing to require inpatient, hospitalization at this time, due to mental status currently not being stable enough for discharge; she is denying SI, but there are concerns that she is minimizing symptoms in an effort of wanting to be discharged. We will be revisiting discharge planning on a daily basis with pt's treatment team.    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 11/2016 Echo: EF 55-60%, Mild MR w/ mild prolapse; b. 07/2019 Echo: EF 50-55%, no rwma. 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   Palpitations    Prolonged QT interval    PTSD (post-traumatic stress disorder)      Past Surgical History:  Procedure Laterality Date   HERNIA REPAIR  2005   Family History:  Family History  Problem Relation Age of Onset   Mitral valve prolapse Mother    Asthma Maternal Grandmother    Cancer Neg Hx    Family Psychiatric  History: See H & P Social History:  Social History   Substance and Sexual Activity  Alcohol Use No   Alcohol/week: 0.0 standard drinks of alcohol     Social History   Substance and Sexual Activity  Drug Use No    Social History   Socioeconomic History   Marital status: Single    Spouse name: Not on file   Number of children: Not on file   Years of education: Not on file   Highest education level: Not on file  Occupational History   Not on file  Tobacco Use   Smoking status: Former   Smokeless tobacco: Never  Vaping Use   Vaping status: Never Used  Substance and Sexual Activity   Alcohol use: No    Alcohol/week: 0.0 standard drinks of alcohol   Drug use: No   Sexual activity: Never  Other Topics Concern   Not on file  Social History Narrative   Lives with Mom and boyfriend.   Sees Dad every other weekend and twice a week.   Immunizations not currently up to date.   Cigarette smoke outside of house.   [employer]   Some learning issues at school, has tutors   Social Drivers of Corporate investment banker Strain: Not on file  Food Insecurity: No Food Insecurity (10/27/2023)   Hunger Vital Sign    Worried About Running Out of Food in the Last Year: Never true    Ran Out of Food in the Last Year: Never true  Transportation Needs: No Transportation Needs (10/27/2023)   PRAPARE - Administrator, Civil Service (Medical): No    Lack of Transportation (Non-Medical): No  Physical Activity: Not on file  Stress: Not on file  Social Connections: Not on file   Sleep: Good  Appetite:  Fair  Current Medications: Current Facility-Administered Medications  Medication Dose Route Frequency Provider Last  Rate Last Admin   acetaminophen (TYLENOL) tablet 650 mg  650 mg Oral Q6H PRN Penn, Cicely, NP       alum & mag hydroxide-simeth (MAALOX/MYLANTA) 200-200-20 MG/5ML suspension 30 mL  30 mL Oral Q4H PRN Penn, Cicely, NP       cyanocobalamin (VITAMIN B12) tablet 1,000 mcg  1,000 mcg Oral Daily Delvecchio Madole, NP       haloperidol (HALDOL) tablet 5 mg  5 mg Oral TID PRN Monroe Antigua, NP       And   diphenhydrAMINE (BENADRYL) capsule 50 mg  50 mg Oral TID PRN Monroe Antigua, NP       haloperidol lactate (HALDOL) injection 10 mg  10 mg Intramuscular TID PRN Penn, Cicely, NP       And   diphenhydrAMINE (BENADRYL) injection 50 mg  50 mg Intramuscular TID PRN Monroe Antigua, NP       And  LORazepam (ATIVAN) injection 2 mg  2 mg Intramuscular TID PRN Penn, Alaine Howells, NP       haloperidol lactate (HALDOL) injection 5 mg  5 mg Intramuscular TID PRN Penn, Alaine Howells, NP       And   diphenhydrAMINE (BENADRYL) injection 50 mg  50 mg Intramuscular TID PRN Penn, Alaine Howells, NP       And   LORazepam (ATIVAN) injection 2 mg  2 mg Intramuscular TID PRN Monroe Antigua, NP       hydrOXYzine (ATARAX) tablet 25 mg  25 mg Oral TID PRN Robet Chiquito, NP   25 mg at 10/28/23 1320   magnesium hydroxide (MILK OF MAGNESIA) suspension 30 mL  30 mL Oral Daily PRN Penn, Alaine Howells, NP       melatonin tablet 3 mg  3 mg Oral QHS Zouev, Dmitri, MD   3 mg at 10/28/23 2106   sertraline (ZOLOFT) tablet 50 mg  50 mg Oral Daily Zouev, Dmitri, MD   50 mg at 10/29/23 1610   Vitamin D (Ergocalciferol) (DRISDOL) 1.25 MG (50000 UNIT) capsule 50,000 Units  50,000 Units Oral Q7 days Robet Chiquito, NP        Lab Results:  Results for orders placed or performed during the hospital encounter of 10/27/23 (from the past 48 hours)  Pregnancy, urine     Status: None   Collection Time: 10/28/23  4:28 PM  Result Value Ref Range   Preg Test, Ur NEGATIVE NEGATIVE    Comment:        THE SENSITIVITY OF THIS METHODOLOGY IS >25 mIU/mL. Performed at [facility], [address], [place], [place] [address]   Basic metabolic panel     Status: Abnormal   Collection Time: 10/28/23  6:17 PM  Result Value Ref Range   Sodium 136 135 - 145 mmol/L   Potassium 4.3 3.5 - 5.1 mmol/L   Chloride 102 98 - 111 mmol/L   CO2 23 22 - 32 mmol/L   Glucose, Bld 143 (H) 70 - 99 mg/dL    Comment: Glucose reference range applies only to samples taken after fasting for at least 8 hours.   BUN 13 6 - 20 mg/dL   Creatinine, Ser 4.09 0.44 - 1.00 mg/dL   Calcium 9.5 8.9 - 10.3 mg/dL   GFR, Estimated >81 >19 mL/min    Comment: (NOTE) Calculated using the CKD-EPI Creatinine Equation (2021)    Anion gap 11 5 - 15    Comment: Performed at [facility], [address] [place], [place] [address]  TSH     Status: None   Collection Time: 10/29/23  6:23 AM  Result Value Ref Range   TSH 1.060 0.350 - 4.500 uIU/mL    Comment: Performed by a 3rd Generation assay with a functional sensitivity of <=0.01 uIU/mL. Performed at [facility], [address], [place], [place] [address]   Lipid panel     Status: Abnormal   Collection Time: 10/29/23  6:23 AM  Result Value Ref Range   Cholesterol 203 (H) 0 - 200 mg/dL   Triglycerides 83 <308 mg/dL   HDL 52 >65 mg/dL   Total CHOL/HDL Ratio 3.9 RATIO   VLDL 17 0 - 40 mg/dL   LDL Cholesterol 784 (H) 0 - 99 mg/dL    Comment:        Total Cholesterol/HDL:CHD Risk Coronary Heart Disease Risk Table  Men   Women  1/2 Average Risk   3.4   3.3  Average Risk       5.0   4.4  2 X Average Risk   9.6   7.1  3 X Average Risk  23.4   11.0        Use the calculated Patient Ratio above and the CHD Risk Table to determine the patient's CHD Risk.        ATP III CLASSIFICATION (LDL):  <100     mg/dL   Optimal  696-295  mg/dL   Near or Above                    Optimal  130-159  mg/dL   Borderline  284-132  mg/dL   High  >440     mg/dL   Very High Performed at [facility], [address] [place], [place] [address]   Vitamin B12     Status: Abnormal   Collection Time: 10/29/23  6:23 AM  Result Value Ref Range   Vitamin B-12 109 (L) 180 - 914 pg/mL    Comment: (NOTE) This assay is not validated for testing neonatal or myeloproliferative syndrome specimens for Vitamin B12 levels. Performed at [facility], [address], [place], [place] [address]   VITAMIN D 25 Hydroxy (Vit-D Deficiency, Fractures)     Status: Abnormal   Collection Time: 10/29/23  6:23 AM  Result Value Ref Range   Vit D, 25-Hydroxy 28.97 (L) 30 - 100 ng/mL    Comment: (NOTE) Vitamin D deficiency has been defined by the Institute of Medicine  and an Endocrine Society practice guideline as a level of serum 25-OH  vitamin D less than 20 ng/mL (1,2). The Endocrine Society went on to  further define vitamin D insufficiency as a level between 21 and 29  ng/mL (2).  1. IOM (Institute of Medicine). 2010. Dietary reference intakes for  calcium and D. [place]: [employer]. 2. Holick MF, [place], Bischoff-Ferrari HA, et al. Evaluation,  treatment, and prevention of vitamin D deficiency: an Endocrine  Society clinical practice guideline, JCEM. 2011 Jul; 96(7): 1911-30.  Performed at [facility], [address] [place], [place] [address]     Blood Alcohol level:  Lab Results  Component Value Date   ETH <10 10/26/2023    Metabolic Disorder Labs: No results found for: "HGBA1C", "MPG" No results found for: "PROLACTIN" Lab Results  Component Value Date   CHOL 203 (H) 10/29/2023   TRIG 83 10/29/2023   HDL 52 10/29/2023   CHOLHDL 3.9 10/29/2023   VLDL 17 10/29/2023   LDLCALC 134 (H) 10/29/2023   LDLCALC (H) 09/10/2010    112        Total Cholesterol/HDL:CHD Risk Coronary Heart Disease Risk Table                     Men   Women  1/2 Average Risk   3.4   3.3  Average Risk       5.0   4.4  2 X Average  Risk   9.6   7.1  3 X Average Risk  23.4   11.0        Use the calculated Patient Ratio above and the CHD Risk Table to determine the patient's CHD Risk.        ATP III CLASSIFICATION (LDL):  <100     mg/dL  Optimal  100-129  mg/dL   Near or Above                    Optimal  130-159  mg/dL   Borderline  161-096  mg/dL   High  >045     mg/dL   Very High    Physical Findings: AIMS:  , ,  ,  ,    CIWA:    COWS:     Musculoskeletal: Strength & Muscle Tone: within normal limits Gait & Station: normal Patient leans: N/A  Psychiatric Specialty Exam:  Presentation  General Appearance:  Casual; Fairly Groomed  Eye Contact: Fair  Speech: Clear and Coherent  Speech Volume: Normal  Handedness: Right   Mood and Affect  Mood: Depressed; Anxious  Affect: Congruent   Thought Process  Thought Processes: Coherent  Descriptions of Associations:Intact  Orientation:Full (Time, Place and Person)  Thought Content:Logical  History of Schizophrenia/Schizoaffective disorder:No data recorded Duration of Psychotic Symptoms:No data recorded Hallucinations:Hallucinations: None  Ideas of Reference:None  Suicidal Thoughts:Suicidal Thoughts: No  Homicidal Thoughts:Homicidal Thoughts: No   Sensorium  Memory: Immediate Fair  Judgment: Fair  Insight: Fair   Art therapist  Concentration: Fair  Attention Span:Fair  Recall: [employer] of Knowledge: Fair  Language: Fair   Psychomotor Activity  Psychomotor Activity: Psychomotor Activity: Normal   Assets  Assets: Communication Skills   Sleep  Sleep: Sleep: Good    Physical Exam: Physical Exam Constitutional:      Appearance: Normal appearance.  Eyes:     Pupils: Pupils are equal, round, and reactive to light.  Musculoskeletal:        General: Normal range of motion.     Cervical back: Normal range of motion.  Skin:    General: Skin is warm.  Neurological:     General: No  focal deficit present.     Mental Status: She is alert and oriented to person, place, and time.    Review of Systems  Psychiatric/Behavioral:  Positive for depression. Negative for hallucinations, memory loss, substance abuse and suicidal ideas. The patient is nervous/anxious and has insomnia.    Blood pressure (!) 92/57, pulse 90, temperature 97.8 F (36.6 C), temperature source Oral, resp. rate 12, height 5\' 6"  (1.676 m), weight 46.7 kg, last menstrual period 10/26/2023, SpO2 94%. Body mass index is 16.62 kg/m.  Treatment Plan Summary: Daily contact with patient to assess and evaluate symptoms and progress in treatment and Medication management   Safety and Monitoring: Voluntary admission to inpatient psychiatric unit for safety, stabilization and treatment Daily contact with patient to assess and evaluate symptoms and progress in treatment Patient's case to be discussed in multi-disciplinary team meeting Observation Level : q15 minute checks Vital signs: q12 hours Precautions: Safety   Long Term Goal(s): Improvement in symptoms so as ready for discharge   Short Term Goals: Ability to identify changes in lifestyle to reduce recurrence of condition will improve, Ability to verbalize feelings will improve, Ability to disclose and discuss suicidal ideas, Ability to demonstrate self-control will improve, Ability to identify and develop effective coping behaviors will improve, Ability to maintain clinical measurements within normal limits will improve, Compliance with prescribed medications will improve, and Ability to identify triggers associated with substance abuse/mental health issues will improve   Diagnoses Principal Problem:   MDD (major depressive disorder), recurrent severe, without psychosis (HCC) Active Problems:   GAD (generalized anxiety disorder)   DiGeorge's syndrome (HCC)   Medications -Continue Zoloft 50 mg  daily for MDD & GAD -Continue Melatonin 3 mg nightly for  sleep -Continue nicotine patch 14 mg Q 24 Hrs for nicotine use disorder. -Start Vitamin B12 1000 mg daily for supplementation -Start Vitamin D 50.000 units weekly for supplementation    PRNS -Continue hydroxyzine 25 mg 3 times daily as needed for anxiety -Decrease dosaging of agitation protocol medications: Reduce doses to 25 mg, 1 mg, and 5 mg for Benadryl, Ativan, and Haldol (previously 50 mg, 2 mg, and 10 mg respectively p.o. or IM 3 times daily as needed for agitation). -Continue Tylenol 650 mg every 6 hours PRN for mild pain -Continue Maalox 30 mg every 4 hrs PRN for indigestion -Continue Milk of Magnesia as needed every 6 hrs for constipation   Labs: Repeat BMP with K now WNL. Vit D & B12 slightly low, will supplement both.  EKG with Qtc 456. Urine pregnancy urine test negative.   Discharge Planning: Social work and case management to assist with discharge planning and identification of hospital follow-up needs prior to discharge Estimated LOS: 5-7 days Discharge Concerns: Need to establish a safety plan; Medication compliance and effectiveness Discharge Goals: Return home with outpatient referrals for mental health follow-up including medication management/psychotherapy   I certify that inpatient services furnished can reasonably be expected to improve the patient's condition.    Robet Chiquito, NP 10/29/2023, 3:11 PM

## 2023-10-29 NOTE — Group Note (Signed)
 Date:  10/29/2023 Time:  9:26 AM  Group Topic/Focus:  Healthy Communication:   The focus of this group is to discuss communication, barriers to communication, as well as healthy ways to communicate with others. Orientation:   The focus of this group is to educate the patient on the purpose and policies of crisis stabilization and provide a format to answer questions about their admission.  The group details unit policies and expectations of patients while admitted.    Participation Level:  Active  Participation Quality:  Appropriate  Affect:  Appropriate  Cognitive:  Appropriate  Insight: Good  Engagement in Group:  Engaged  Modes of Intervention:  Discussion and Orientation  Additional Comments:    Violette Grief 10/29/2023, 9:26 AM

## 2023-10-29 NOTE — Plan of Care (Signed)
   Problem: Education: Goal: Emotional status will improve Outcome: Progressing Goal: Mental status will improve Outcome: Progressing   Problem: Activity: Goal: Interest or engagement in activities will improve Outcome: Progressing Goal: Sleeping patterns will improve Outcome: Progressing   Problem: Safety: Goal: Periods of time without injury will increase Outcome: Progressing

## 2023-10-30 ENCOUNTER — Encounter (HOSPITAL_COMMUNITY): Payer: Self-pay

## 2023-10-30 DIAGNOSIS — F332 Major depressive disorder, recurrent severe without psychotic features: Secondary | ICD-10-CM | POA: Diagnosis not present

## 2023-10-30 LAB — HEMOGLOBIN A1C
Hgb A1c MFr Bld: 5.3 % (ref 4.8–5.6)
Mean Plasma Glucose: 105 mg/dL

## 2023-10-30 MED ORDER — NITROFURANTOIN MONOHYD MACRO 100 MG PO CAPS
100.0000 mg | ORAL_CAPSULE | Freq: Two times a day (BID) | ORAL | Status: DC
  Administered 2023-10-30 – 2023-11-01 (×4): 100 mg via ORAL
  Filled 2023-10-30: qty 1
  Filled 2023-10-30: qty 6
  Filled 2023-10-30 (×6): qty 1
  Filled 2023-10-30: qty 6

## 2023-10-30 MED ORDER — SERTRALINE HCL 100 MG PO TABS
100.0000 mg | ORAL_TABLET | Freq: Every day | ORAL | Status: DC
Start: 2023-10-31 — End: 2023-10-31
  Filled 2023-10-30 (×3): qty 1

## 2023-10-30 NOTE — Progress Notes (Signed)
   10/30/23 0800  Psych Admission Type (Psych Patients Only)  Admission Status Involuntary  Psychosocial Assessment  Patient Complaints Anxiety  Eye Contact Fair  Facial Expression Worried  Affect Anxious  Speech Logical/coherent  Interaction Assertive  Motor Activity Fidgety  Appearance/Hygiene Disheveled  Behavior Characteristics Cooperative;Appropriate to situation  Mood Preoccupied;Pleasant  Aggressive Behavior  Effect No apparent injury  Thought Process  Coherency WDL  Content WDL  Delusions WDL  Perception WDL  Hallucination None reported or observed  Judgment WDL  Confusion WDL  Danger to Self  Current suicidal ideation? Denies  Self-Injurious Behavior No self-injurious ideation or behavior indicators observed or expressed   Agreement Not to Harm Self Yes  Description of Agreement Verbal  Danger to Others  Danger to Others None reported or observed

## 2023-10-30 NOTE — [facility] Suicide Risk Assessment (Signed)
 [facility] INPATIENT:  Family/Significant Other Suicide Prevention Education  Suicide Prevention Education:  Education Completed; [name] [name] [phone] (father),  (name of family member/significant other) has been identified by the patient as the family member/significant other with whom the patient will be residing, and identified as the person(s) who will aid the patient in the event of a mental health crisis (suicidal ideations/suicide attempt).  With written consent from the patient, the family member/significant other has been provided the following suicide prevention education, prior to the and/or following the discharge of the patient.  Able to return home at discharge, does not have access to guns or weapons, will be picked up on Friday by sister.  The suicide prevention education provided includes the following: Suicide risk factors Suicide prevention and interventions National Suicide Hotline telephone number [facility] assessment telephone number [facility] Emergency Assistance [facility] and/or Residential Mobile Crisis Unit telephone number  Request made of family/significant other to: Remove weapons (e.g., guns, rifles, knives), all items previously/currently identified as safety concern.   Remove drugs/medications (over-the-counter, prescriptions, illicit drugs), all items previously/currently identified as a safety concern.  The family member/significant other verbalizes understanding of the suicide prevention education information provided.  The family member/significant other agrees to remove the items of safety concern listed above.  Vonzell Guerin 10/30/2023, 1:03 PM

## 2023-10-30 NOTE — [facility] Group Notes (Signed)
 Spiritual care group on grief and loss facilitated by Chaplain Nick Barman, [facility]  Group Goal: Support / Education around grief and loss  Members engage in facilitated group support and psycho-social education.  Group Description:  Following introductions and group rules, group members engaged in facilitated group dialogue and support around topic of loss, with particular support around experiences of loss in their lives. Group Identified types of loss (relationships / self / things) and identified patterns, circumstances, and changes that precipitate losses. Reflected on thoughts / feelings around loss, normalized grief responses, and recognized variety in grief experience. Group encouraged individual reflection on safe space and on the coping skills that they are already utilizing.  Group drew on Adlerian / Rogerian and narrative framework  Patient Progress: Sheila Camacho attended group.  Though verbal participation was minimal, she demonstrated engagement in the group conversation.

## 2023-10-30 NOTE — Group Note (Signed)
 Recreation Therapy Group Note   Group Topic:Stress Management  Group Date: 10/30/2023 Start Time: 0930 End Time: 0954 Facilitators: Giana Castner-McCall, LRT,CTRS Location: [address] Dayroom   Group Topic: Stress Management  Goal Area(s) Addresses:  Patient will identify positive stress management techniques. Patient will identify benefits of using stress management post d/c.  Intervention: Insight Timer App  Activity: Meditation. LRT played a meditation for patients that focused on morning energy and focus. Patients were to listen and follow along as the meditation took them on a journey to prepare them for the day ahead.    Education:  Stress Management, Discharge Planning.   Education Outcome: Acknowledges Education   Affect/Mood: Appropriate   Participation Level: Minimal   Participation Quality: Independent   Behavior: Distracted   Speech/Thought Process: Relevant   Insight: Moderate   Judgement: Moderate   Modes of Intervention: App   Patient Response to Interventions:  Receptive   Education Outcome:  In group clarification offered    Clinical Observations/Individualized Feedback: Pt was distracted looking around and talking as meditation started. Pt was able to be redirected and was quiet remainder of group.      Plan: Continue to engage patient in RT group sessions 2-3x/week.   Sheila Camacho, LRT,CTRS 10/30/2023 1:35 PM

## 2023-10-30 NOTE — Group Note (Signed)
 Occupational Therapy Group Note  Group Topic: Sleep Hygiene  Group Date: 10/30/2023 Start Time: 1422 End Time: 1558 Facilitators: Lynnda Sas, OT   Group Description: Group encouraged increased participation and engagement through topic focused on sleep hygiene. Patients reflected on the quality of sleep they typically receive and identified areas that need improvement. Group was given background information on sleep and sleep hygiene, including common sleep disorders. Group members also received information on how to improve one's sleep and introduced a sleep diary as a tool that can be utilized to track sleep quality over a length of time. Group session ended with patients identifying one or more strategies they could utilize or implement into their sleep routine in order to improve overall sleep quality.        Therapeutic Goal(s):  Identify one or more strategies to improve overall sleep hygiene  Identify one or more areas of sleep that are negatively impacted (sleep too much, too little, etc)     Participation Level: Engaged   Participation Quality: Independent   Behavior: Appropriate   Speech/Thought Process: Relevant   Affect/Mood: Appropriate   Insight: Fair   Judgement: Fair      Modes of Intervention: Education  Patient Response to Interventions:  Attentive   Plan: Continue to engage patient in OT groups 2 - 3x/week.  10/30/2023  Lynnda Sas, OT  Sheila Camacho, OT

## 2023-10-30 NOTE — Group Note (Signed)
 Date:  10/30/2023 Time:  10:09 AM  Group Topic/Focus:  Goals Group:   The focus of this group is to help patients establish daily goals to achieve during treatment and discuss how the patient can incorporate goal setting into their daily lives to aide in recovery.    Participation Level:  Did Not Attend  Sheila Camacho 10/30/2023, 10:09 AM

## 2023-10-30 NOTE — Progress Notes (Addendum)
 [facility] MD Progress Note  10/30/2023 5:53 PM Sheila Camacho  MRN:  [id number] Principal Problem: MDD (major depressive disorder), recurrent severe, without psychosis (HCC) Diagnosis: Principal Problem:   MDD (major depressive disorder), recurrent severe, without psychosis (HCC) Active Problems:   GAD (generalized anxiety disorder)   DiGeorge's syndrome (HCC)  HPI: Sheila Camacho is a 28 y.o. Caucasian female with prior mental health diagnoses of MDD & ADHD & a medical history of QT prolongation & DiGeorge syndrome who presented initially to the [facility] 4/10 accompanied by her sister after an overdose on Benadryl with an intent to end her life.  As per ER documentation, patient took "8 50 mg Raxell Benadryl sleep aides". Patient was treated and medically cleared prior to being transferred to this [facility] under IVC status for treatment & stabilization of her mental status.   24hr Chart Review: Patient slept 8 hours as per nursing flowsheets. Patient asking about discharge, otherwise no nursing concerns. Patient is medication compliant and taking scheduled Zoloft. No PRN medications requested by patient or given in the past 24hrs. Vitals signs in the last 24hrs WNL. Labs in the last 24 hours include UA, positive for small Hgb and leukocytes. Mucus present on UA microscopy. Patient endorsed UTI symptoms yesterday.  Today's Patient Assessment: On assessment today, the pt reports that their mood is good and "a lot better today." She is wanting to sleep.  Elevated to know when she can leave and if Wednesday is a possibility. Educated on discharge timeframe being more likely towards the end of the week due to her high suicide risk. No discharge day set yet. Again discussed focusing on improving her mental health instead of discharge date. States she is doing better with utilizing meditation and making friends on the unit. Reports that anxiety is improved, though she is  fidgeting on exam but repeating tucking her hair behind her ears. Sleep is good, reports 8 hours. Appetite is "fair, eating all three meals." Concentration is objectively poor on assessment today and according to distractibility in group therapy notes.  Energy level is improved from yesterday. Denies suicidal thoughts. Denies suicidal intent and plan. Objectively seems depressed, but improved from assessment yesterday. Concern for minimizing depressive symptoms due to focusing on discharge charge date. Denies having any HI.  Denies having psychotic symptoms.   Denies having side effects to current psychiatric medications.   We discussed changes to current medication regimen, including increasing the Zoloft from 50mg  to 100mg  starting tomorrow 4/14 to be given at bedtime. Patient tolerating Zoloft increase from 25mg  to 50mg .  Discussed the following psychosocial stressors: wanting to be discharged and thinking about her initial experience in the emergency department with this attempt. Patient states she had been thinking about how she :heard yelling down the hall and thought it was her mother." She states she frequently thinks of her mother since she passed away d/t suicide. Active listening and empathy provided. Patient is continuing to require inpatient, hospitalization at this time, due to mental status currently not being stable enough for discharge; she is denying SI, but there are concerns that she is minimizing symptoms in an effort of wanting to be discharged. We will be revisiting discharge planning on a daily basis with pt's treatment team.  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 11/2016 Echo: EF 55-60%, Mild MR w/ mild prolapse; b. 07/2019 Echo: EF 50-55%, no rwma. 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   Palpitations    Prolonged QT interval    PTSD (post-traumatic stress disorder)     Past Surgical History:  Procedure Laterality Date   HERNIA REPAIR  2005   Family History:  Family History  Problem Relation Age of Onset   Mitral valve prolapse Mother    Asthma Maternal Grandmother    Cancer Neg Hx    Family Psychiatric  History: See H&P Social History:  Social History   Substance and Sexual Activity  Alcohol Use No   Alcohol/week: 0.0 standard drinks of alcohol     Social History   Substance and Sexual Activity  Drug Use No    Social History   Socioeconomic History   Marital status: Single    Spouse name: Not on file   Number of children: Not on file   Years of education: Not on file   Highest education level: Not on file  Occupational History   Not on file  Tobacco Use   Smoking status: Former   Smokeless tobacco: Never  Vaping Use   Vaping status: Never Used  Substance and Sexual Activity   Alcohol use: No    Alcohol/week: 0.0 standard drinks of alcohol   Drug use: No   Sexual activity: Never  Other Topics Concern   Not on file  Social History Narrative   Lives with Mom and boyfriend.   Sees Dad every other weekend and twice a week.   Immunizations not currently up to date.   Cigarette smoke outside of house.   [employer]   Some learning issues at school, has tutors   Social Drivers of Corporate investment banker Strain: Not on file  Food Insecurity: No Food Insecurity (10/27/2023)   Hunger Vital Sign    Worried About Running Out of Food in the Last Year: Never true    Ran Out of Food in the Last Year: Never true  Transportation Needs: No Transportation Needs (10/27/2023)   PRAPARE - Administrator, Civil Service (Medical): No    Lack of Transportation (Non-Medical): No  Physical Activity: Not on file   Stress: Not on file  Social Connections: Not on file   Sleep: Good  Appetite:  Good  Current Medications: Current Facility-Administered Medications  Medication Dose Route Frequency Provider Last Rate Last Admin   acetaminophen (TYLENOL) tablet 650 mg  650 mg Oral Q6H PRN Penn, Cicely, NP       alum & mag hydroxide-simeth (MAALOX/MYLANTA) 200-200-20 MG/5ML suspension 30 mL  30 mL Oral Q4H PRN Penn, Cicely, NP       cyanocobalamin (VITAMIN B12) tablet 1,000 mcg  1,000 mcg Oral Daily Robet Chiquito, NP   1,000 mcg at 10/30/23 0742   haloperidol (HALDOL) tablet 5 mg  5 mg Oral TID PRN Monroe Antigua, NP       And   diphenhydrAMINE (BENADRYL) capsule 50 mg  50 mg Oral TID PRN Monroe Antigua, NP       haloperidol lactate (HALDOL) injection 10 mg  10 mg Intramuscular TID PRN Penn, Alaine Howells, NP       And   diphenhydrAMINE (BENADRYL) injection 50 mg  50 mg Intramuscular TID PRN Mcneil Sober, NP       And   LORazepam (ATIVAN) injection 2 mg  2 mg Intramuscular TID PRN Penn, Cranston Neighbor, NP       haloperidol lactate (HALDOL) injection 5 mg  5 mg Intramuscular TID PRN Mcneil Sober, NP       And   diphenhydrAMINE (BENADRYL) injection 50 mg  50 mg Intramuscular TID PRN Mcneil Sober, NP       And   LORazepam (ATIVAN) injection 2 mg  2 mg Intramuscular TID PRN Mcneil Sober, NP       hydrOXYzine (ATARAX) tablet 25 mg  25 mg Oral TID PRN Starleen Blue, NP   25 mg at 10/28/23 1320   magnesium hydroxide (MILK OF MAGNESIA) suspension 30 mL  30 mL Oral Daily PRN Penn, Cranston Neighbor, NP       melatonin tablet 3 mg  3 mg Oral QHS Miguel Rota, MD   3 mg at 10/29/23 2115   nitrofurantoin (macrocrystal-monohydrate) (MACROBID) capsule 100 mg  100 mg Oral Q12H Oluwaseyi Tull, NP       phenazopyridine (PYRIDIUM) tablet 100 mg  100 mg Oral BID Starleen Blue, NP   100 mg at 10/30/23 0742   [START ON 10/31/2023] sertraline (ZOLOFT) tablet 100 mg  100 mg Oral Daily Chantry Headen, Tyler Aas, NP       Vitamin D (Ergocalciferol) (DRISDOL) 1.25  MG (50000 UNIT) capsule 50,000 Units  50,000 Units Oral Q7 days Starleen Blue, NP   50,000 Units at 10/29/23 1627    Lab Results:  Results for orders placed or performed during the hospital encounter of 10/27/23 (from the past 48 hours)  Basic metabolic panel     Status: Abnormal   Collection Time: 10/28/23  6:17 PM  Result Value Ref Range   Sodium 136 135 - 145 mmol/L   Potassium 4.3 3.5 - 5.1 mmol/L   Chloride 102 98 - 111 mmol/L   CO2 23 22 - 32 mmol/L   Glucose, Bld 143 (H) 70 - 99 mg/dL    Comment: Glucose reference range applies only to samples taken after fasting for at least 8 hours.   BUN 13 6 - 20 mg/dL   Creatinine, Ser 1.61 0.44 - 1.00 mg/dL   Calcium 9.5 8.9 - 09.6 mg/dL   GFR, Estimated >04 >54 mL/min    Comment: (NOTE) Calculated using the CKD-EPI Creatinine Equation (2021)    Anion gap 11 5 - 15    Comment: Performed at [facility], [address], [place], [place] [address]  TSH     Status: None   Collection Time: 10/29/23  6:23 AM  Result Value Ref Range   TSH 1.060 0.350 - 4.500 uIU/mL    Comment: Performed by a 3rd Generation assay with a functional sensitivity of <=0.01 uIU/mL. Performed at [facility], [address] [place], [place] [address]   Lipid panel     Status: Abnormal   Collection Time: 10/29/23  6:23 AM  Result Value Ref Range   Cholesterol 203 (H) 0 - 200 mg/dL   Triglycerides 83 <295 mg/dL   HDL 52 >62 mg/dL   Total CHOL/HDL Ratio 3.9 RATIO   VLDL 17 0 - 40 mg/dL   LDL Cholesterol 130 (H) 0 - 99 mg/dL    Comment:        Total Cholesterol/HDL:CHD Risk Coronary Heart Disease Risk Table  Men   Women  1/2 Average Risk   3.4   3.3  Average Risk       5.0   4.4  2 X Average Risk   9.6   7.1  3 X Average Risk  23.4   11.0        Use the calculated Patient Ratio above and the CHD Risk Table to determine the patient's CHD Risk.        ATP III CLASSIFICATION (LDL):  <100      mg/dL   Optimal  811-914  mg/dL   Near or Above                    Optimal  130-159  mg/dL   Borderline  782-956  mg/dL   High  >213     mg/dL   Very High Performed at [facility], [address], [place], [place] [address]   Hemoglobin A1c     Status: None   Collection Time: 10/29/23  6:23 AM  Result Value Ref Range   Hgb A1c MFr Bld 5.3 4.8 - 5.6 %    Comment: (NOTE)         Prediabetes: 5.7 - 6.4         Diabetes: >6.4         Glycemic control for adults with diabetes: <7.0    Mean Plasma Glucose 105 mg/dL    Comment: (NOTE) Performed At: [facility] [address] [place], [place] [id number] Jolene Schimke MD WU:[id number]   Vitamin B12     Status: Abnormal   Collection Time: 10/29/23  6:23 AM  Result Value Ref Range   Vitamin B-12 109 (L) 180 - 914 pg/mL    Comment: (NOTE) This assay is not validated for testing neonatal or myeloproliferative syndrome specimens for Vitamin B12 levels. Performed at [facility], [address], [place], [place] [address]   VITAMIN D 25 Hydroxy (Vit-D Deficiency, Fractures)     Status: Abnormal   Collection Time: 10/29/23  6:23 AM  Result Value Ref Range   Vit D, 25-Hydroxy 28.97 (L) 30 - 100 ng/mL    Comment: (NOTE) Vitamin D deficiency has been defined by the Institute of Medicine  and an Endocrine Society practice guideline as a level of serum 25-OH  vitamin D less than 20 ng/mL (1,2). The Endocrine Society went on to  further define vitamin D insufficiency as a level between 21 and 29  ng/mL (2).  1. IOM (Institute of Medicine). 2010. Dietary reference intakes for  calcium and D. [place]: [employer]. 2. Holick MF, Binkley Hurricane, Bischoff-Ferrari HA, et al. Evaluation,  treatment, and prevention of vitamin D deficiency: an Endocrine  Society clinical practice guideline, JCEM. 2011 Jul; 96(7): 1911-30.  Performed at [facility], [address],  [place], [place] [address]   Urinalysis, Complete w Microscopic -Urine, Clean Catch     Status: Abnormal   Collection Time: 10/29/23  4:43 PM  Result Value Ref Range   Color, Urine YELLOW YELLOW   APPearance CLEAR CLEAR   Specific Gravity, Urine 1.009 1.005 - 1.030   pH 5.0 5.0 - 8.0   Glucose, UA NEGATIVE NEGATIVE mg/dL   Hgb urine dipstick SMALL (A) NEGATIVE   Bilirubin Urine NEGATIVE NEGATIVE   Ketones, ur NEGATIVE NEGATIVE mg/dL   Protein, ur NEGATIVE NEGATIVE mg/dL   Nitrite NEGATIVE NEGATIVE   Leukocytes,Ua SMALL (A) NEGATIVE   RBC /  HPF 0-5 0 - 5 RBC/hpf   WBC, UA 0-5 0 - 5 WBC/hpf   Bacteria, UA NONE SEEN NONE SEEN   Squamous Epithelial / HPF 0-5 0 - 5 /HPF   Mucus PRESENT     Comment: Performed at [facility], [address] [place], [place] [address]    Blood Alcohol level:  Lab Results  Component Value Date   ETH <10 10/26/2023    Metabolic Disorder Labs: Lab Results  Component Value Date   HGBA1C 5.3 10/29/2023   MPG 105 10/29/2023   No results found for: "PROLACTIN" Lab Results  Component Value Date   CHOL 203 (H) 10/29/2023   TRIG 83 10/29/2023   HDL 52 10/29/2023   CHOLHDL 3.9 10/29/2023   VLDL 17 10/29/2023   LDLCALC 134 (H) 10/29/2023   LDLCALC (H) 09/10/2010    112        Total Cholesterol/HDL:CHD Risk Coronary Heart Disease Risk Table                     Men   Women  1/2 Average Risk   3.4   3.3  Average Risk       5.0   4.4  2 X Average Risk   9.6   7.1  3 X Average Risk  23.4   11.0        Use the calculated Patient Ratio above and the CHD Risk Table to determine the patient's CHD Risk.        ATP III CLASSIFICATION (LDL):  <100     mg/dL   Optimal  191-478  mg/dL   Near or Above                    Optimal  130-159  mg/dL   Borderline  295-621  mg/dL   High  >308     mg/dL   Very High    Musculoskeletal: Strength & Muscle Tone: within normal limits Gait & Station: normal Patient leans: N/A  Psychiatric  Specialty Exam:  Presentation  General Appearance:  Appropriate for Environment; Fairly Groomed  Eye Contact: Fair  Speech: Clear and Coherent  Speech Volume: Normal  Handedness: Right   Mood and Affect  Mood: Depressed  Affect: Appropriate   Thought Process  Thought Processes: Coherent  Descriptions of Associations:Intact  Orientation:Full (Time, Place and Person)  Thought Content:Logical; WDL  History of Schizophrenia/Schizoaffective disorder:No data recorded Duration of Psychotic Symptoms:No data recorded Hallucinations:Hallucinations: None  Ideas of Reference:None  Suicidal Thoughts:Suicidal Thoughts: No  Homicidal Thoughts:Homicidal Thoughts: No   Sensorium  Memory: Recent Fair  Judgment: Fair  Insight: Fair   Chartered certified accountant: Fair  Attention Span: Fair  Recall: [employer] of Knowledge: Fair  Language: Fair   Psychomotor Activity  Psychomotor Activity: Psychomotor Activity: Normal   Assets  Assets: Communication Skills   Sleep  Sleep: Sleep: Fair    Physical Exam: Physical Exam Vitals and nursing note reviewed.  Eyes:     Pupils: Pupils are equal, round, and reactive to light.  Neurological:     General: No focal deficit present.     Mental Status: She is oriented to person, place, and time.    Review of Systems  Psychiatric/Behavioral:  Positive for depression. Negative for hallucinations, memory loss, substance abuse and suicidal ideas. The patient is nervous/anxious and has insomnia.   All other systems reviewed and are negative.  Blood pressure 104/65, pulse 86, temperature  98.4 F (36.9 C), temperature source Oral, resp. rate 16, height 5\' 6"  (1.676 m), weight 46.7 kg, last menstrual period 10/26/2023, SpO2 100%. Body mass index is 16.62 kg/m.  Treatment Plan Summary:  Daily contact with patient to assess and evaluate symptoms and progress in treatment and Medication  management   Safety and Monitoring: Voluntary admission to inpatient psychiatric unit for safety, stabilization and treatment Daily contact with patient to assess and evaluate symptoms and progress in treatment Patient's case to be discussed in multi-disciplinary team meeting Observation Level : q15 minute checks Vital signs: q12 hours Precautions: Safety   Long Term Goal(s): Improvement in symptoms so as ready for discharge   Short Term Goals: Ability to identify changes in lifestyle to reduce recurrence of condition will improve, Ability to verbalize feelings will improve, Ability to disclose and discuss suicidal ideas, Ability to demonstrate self-control will improve, Ability to identify and develop effective coping behaviors will improve, Ability to maintain clinical measurements within normal limits will improve, Compliance with prescribed medications will improve, and Ability to identify triggers associated with substance abuse/mental health issues will improve   Diagnoses Principal Problem:   MDD (major depressive disorder), recurrent severe, without psychosis (HCC) Active Problems:   GAD (generalized anxiety disorder)   DiGeorge's syndrome (HCC)   Medications -Increase Zoloft from 50 to 100 mg daily for MDD & GAD starting 4/15. -Start macrobid 100 mg BID for UTI symptoms x 5 days -Continue Melatonin 3 mg nightly for sleep -Continue nicotine patch 14 mg Q 24 Hrs for nicotine use disorder. -Continue Vitamin B12 1000 mg daily for supplementation -Continue Vitamin D 50.000 units weekly for supplementation    PRNS -Continue hydroxyzine 25 mg 3 times daily as needed for anxiety -Continue Agitation Protocol as per the MAR-See MAR  -Continue Tylenol 650 mg every 6 hours PRN for mild pain -Continue Maalox 30 mg every 4 hrs PRN for indigestion -Continue Milk of Magnesia as needed every 6 hrs for constipation   Labs reviewed, no new orders placed.    Discharge Planning: Social  work and case management to assist with discharge planning and identification of hospital follow-up needs prior to discharge Estimated LOS: 5-7 days Discharge Concerns: Need to establish a safety plan; Medication compliance and effectiveness Discharge Goals: Return home with outpatient referrals for mental health follow-up including medication management/psychotherapy  Robet Chiquito, NP 10/30/2023, 5:53 PM

## 2023-10-30 NOTE — Progress Notes (Signed)
   10/30/23 2015  Psych Admission Type (Psych Patients Only)  Admission Status Involuntary  Psychosocial Assessment  Patient Complaints Anxiety  Eye Contact Fair  Facial Expression Worried  Affect Anxious  Speech Logical/coherent  Interaction Assertive  Motor Activity Fidgety  Appearance/Hygiene Disheveled  Behavior Characteristics Cooperative  Mood Suspicious;Preoccupied  Aggressive Behavior  Effect No apparent injury  Thought Process  Coherency WDL  Content WDL  Delusions WDL  Perception WDL  Hallucination None reported or observed  Judgment WDL  Confusion WDL  Danger to Self  Current suicidal ideation? Denies  Danger to Others  Danger to Others None reported or observed

## 2023-10-30 NOTE — [facility] Group Notes (Signed)
 [facility] Group Notes:  (Nursing/MHT/Case Management/Adjunct)  Date:  10/30/2023  Time:  9:11 PM  Type of Therapy:  Group Therapy  Participation Level:  Active  Participation Quality:  Appropriate and Attentive  Affect:  Appropriate  Cognitive:  Alert and Appropriate  Insight:  Appropriate and Good  Engagement in Group:  Engaged  Modes of Intervention:  Activity and Discussion  Summary of Progress/Problems: Wrap up group  Anniece Kind 10/30/2023, 9:11 PM

## 2023-10-30 NOTE — Plan of Care (Signed)
   Problem: Education: Goal: Emotional status will improve Outcome: Progressing Goal: Mental status will improve Outcome: Progressing   Problem: Activity: Goal: Interest or engagement in activities will improve Outcome: Progressing Goal: Sleeping patterns will improve Outcome: Progressing

## 2023-10-30 NOTE — BH IP Treatment Plan (Signed)
 Interdisciplinary Treatment and Diagnostic Plan Update  10/30/2023 Time of Session: 1359 Dawnya Grams MRN: [id number]  Principal Diagnosis: MDD (major depressive disorder), recurrent severe, without psychosis (HCC)  Secondary Diagnoses: Principal Problem:   MDD (major depressive disorder), recurrent severe, without psychosis (HCC) Active Problems:   GAD (generalized anxiety disorder)   DiGeorge's syndrome (HCC)   Current Medications:  Current Facility-Administered Medications  Medication Dose Route Frequency Provider Last Rate Last Admin   acetaminophen (TYLENOL) tablet 650 mg  650 mg Oral Q6H PRN Penn, Cicely, NP       alum & mag hydroxide-simeth (MAALOX/MYLANTA) 200-200-20 MG/5ML suspension 30 mL  30 mL Oral Q4H PRN Penn, Cicely, NP       cyanocobalamin (VITAMIN B12) tablet 1,000 mcg  1,000 mcg Oral Daily Robet Chiquito, NP   1,000 mcg at 10/30/23 0742   haloperidol (HALDOL) tablet 5 mg  5 mg Oral TID PRN Monroe Antigua, NP       And   diphenhydrAMINE (BENADRYL) capsule 50 mg  50 mg Oral TID PRN Monroe [place]igua, NP       haloperidol lactate (HALDOL) injection 10 mg  10 mg Intramuscular TID PRN Penn, Alaine Howells, NP       And   diphenhydrAMINE (BENADRYL) injection 50 mg  50 mg Intramuscular TID PRN Penn, Alaine Howells, NP       And   LORazepam (ATIVAN) injection 2 mg  2 mg Intramuscular TID PRN Penn, Alaine Howells, NP       haloperidol lactate (HALDOL) injection 5 mg  5 mg Intramuscular TID PRN Penn, Alaine Howells, NP       And   diphenhydrAMINE (BENADRYL) injection 50 mg  50 mg Intramuscular TID PRN Penn, Alaine Howells, NP       And   LORazepam (ATIVAN) injection 2 mg  2 mg Intramuscular TID PRN Monroe Antigua, NP       hydrOXYzine (ATARAX) tablet 25 mg  25 mg Oral TID PRN Robet Chiquito, NP   25 mg at 10/28/23 1320   magnesium hydroxide (MILK OF MAGNESIA) suspension 30 mL  30 mL Oral Daily PRN Penn, Alaine Howells, NP       melatonin tablet 3 mg  3 mg Oral QHS Zouev, Dmitri, MD   3 mg at 10/29/23 2115    phenazopyridine (PYRIDIUM) tablet 100 mg  100 mg Oral BID Nkwenti, Doris, NP   100 mg at 10/30/23 7829   sertraline (ZOLOFT) tablet 50 mg  50 mg Oral Daily Zouev, Dmitri, MD   50 mg at 10/30/23 0742   Vitamin D (Ergocalciferol) (DRISDOL) 1.25 MG (50000 UNIT) capsule 50,000 Units  50,000 Units Oral Q7 days Robet Chiquito, NP   50,000 Units at 10/29/23 1627   PTA Medications: Facility-Administered Medications Prior to Admission  Medication Dose Route Frequency Provider Last Rate Last Admin   ulipristal acetate (ELLA) tablet 30 mg  30 mg Oral Once Abner Ables, MD       Medications Prior to Admission  Medication Sig Dispense Refill Last Dose/Taking   diphenhydrAMINE (BENADRYL) 50 MG capsule Take 50 mg by mouth every 6 (six) hours as needed for allergies, itching or sleep.       Patient Stressors:    Patient Strengths:    Treatment Modalities: Medication Management, Group therapy, Case management,  1 to 1 session with clinician, Psychoeducation, Recreational therapy.   Physician Treatment Plan for Primary Diagnosis: MDD (major depressive disorder), recurrent severe, without psychosis (HCC) Long Term Goal(s): Improvement in symptoms so as ready for discharge  Short Term Goals: Ability to identify changes in lifestyle to reduce recurrence of condition will improve Ability to verbalize feelings will improve Ability to disclose and discuss suicidal ideas Ability to demonstrate self-control will improve Ability to identify and develop effective coping behaviors will improve Ability to maintain clinical measurements within normal limits will improve Compliance with prescribed medications will improve Ability to identify triggers associated with substance abuse/mental health issues will improve  Medication Management: Evaluate patient's response, side effects, and tolerance of medication regimen.  Therapeutic Interventions: 1 to 1 sessions, Unit Group sessions and Medication  administration.  Evaluation of Outcomes: Not Progressing  Physician Treatment Plan for Secondary Diagnosis: Principal Problem:   MDD (major depressive disorder), recurrent severe, without psychosis (HCC) Active Problems:   GAD (generalized anxiety disorder)   DiGeorge's syndrome (HCC)  Long Term Goal(s): Improvement in symptoms so as ready for discharge   Short Term Goals: Ability to identify changes in lifestyle to reduce recurrence of condition will improve Ability to verbalize feelings will improve Ability to disclose and discuss suicidal ideas Ability to demonstrate self-control will improve Ability to identify and develop effective coping behaviors will improve Ability to maintain clinical measurements within normal limits will improve Compliance with prescribed medications will improve Ability to identify triggers associated with substance abuse/mental health issues will improve     Medication Management: Evaluate patient's response, side effects, and tolerance of medication regimen.  Therapeutic Interventions: 1 to 1 sessions, Unit Group sessions and Medication administration.  Evaluation of Outcomes: Not Progressing   RN Treatment Plan for Primary Diagnosis: MDD (major depressive disorder), recurrent severe, without psychosis (HCC) Long Term Goal(s): Knowledge of disease and therapeutic regimen to maintain health will improve  Short Term Goals: Ability to demonstrate self-control, Ability to participate in decision making will improve, Ability to verbalize feelings will improve, Ability to disclose and discuss suicidal ideas, and Compliance with prescribed medications will improve  Medication Management: RN will administer medications as ordered by provider, will assess and evaluate patient's response and provide education to patient for prescribed medication. RN will report any adverse and/or side effects to prescribing provider.  Therapeutic Interventions: 1 on 1  counseling sessions, Psychoeducation, Medication administration, Evaluate responses to treatment, Monitor vital signs and CBGs as ordered, Perform/monitor CIWA, COWS, AIMS and Fall Risk screenings as ordered, Perform wound care treatments as ordered.  Evaluation of Outcomes: Not Progressing   LCSW Treatment Plan for Primary Diagnosis: MDD (major depressive disorder), recurrent severe, without psychosis (HCC) Long Term Goal(s): Safe transition to appropriate next level of care at discharge, Engage patient in therapeutic group addressing interpersonal concerns.  Short Term Goals: Engage patient in aftercare planning with referrals and resources, Increase social support, Increase ability to appropriately verbalize feelings, Increase emotional regulation, and Increase skills for wellness and recovery  Therapeutic Interventions: Assess for all discharge needs, 1 to 1 time with Social worker, Explore available resources and support systems, Assess for adequacy in community support network, Educate family and significant other(s) on suicide prevention, Complete Psychosocial Assessment, Interpersonal group therapy.  Evaluation of Outcomes: Not Progressing   Progress in Treatment: Attending groups: Yes. Participating in groups: Yes. Taking medication as prescribed: Yes. Toleration medication: Yes. Family/Significant other contact made: Yes, individual(s) contacted:  [name] [name] [phone] (father) Patient understands diagnosis: Yes. Discussing patient identified problems/goals with staff: Yes. Medical problems stabilized or resolved: Yes. Denies suicidal/homicidal ideation: Yes. Issues/concerns per patient self-inventory: No. Other: N/a   New problem(s) identified: No, Describe:  None  New Short Term/Long  Term Goal(s): medication stabilization, elimination of SI thoughts, development of comprehensive mental wellness plan.   Patient Goals:  "Get back on my meds and stability"  Discharge  Plan or Barriers: Patient recently admitted. CSW will continue to follow and assess for appropriate referrals and possible discharge planning.    Reason for Continuation of Hospitalization: Anxiety Medication stabilization Suicidal ideation Other; describe Mood stabilization, discharge planning  Estimated Length of Stay:  Last 3 [place] Suicide Severity Risk Score: Flowsheet Row Admission (Current) [facility] from [facility]/2023 in [facility] at [facility] ED from 03/02/2023 in [facility] Emergency Department at [facility]  C-SSRS RISK CATEGORY High Risk High Risk No Risk       Last PHQ 2/9 Scores:    05/31/2022    9:13 AM 02/09/2017    3:15 PM 02/09/2017    2:59 PM  Depression screen PHQ 2/9  Decreased Interest 0 2 2  Down, Depressed, Hopeless 0 1 2  PHQ - 2 Score 0 3 4  Altered sleeping  1 1  Tired, decreased energy  0 2  Change in appetite  3 1  Feeling bad or failure about yourself   1 1  Trouble concentrating  0 0  Moving slowly or fidgety/restless  0 0  Suicidal thoughts   0  PHQ-9 Score  8 9    Scribe for Treatment Team: Nakeisha Greenhouse N [place], LCSW 10/30/2023 5:25 PM

## 2023-10-30 NOTE — Plan of Care (Signed)
  Problem: Activity: Goal: Interest or engagement in activities will improve Outcome: Progressing   Problem: Coping: Goal: Ability to verbalize frustrations and anger appropriately will improve Outcome: Progressing   Problem: Physical Regulation: Goal: Ability to maintain clinical measurements within normal limits will improve Outcome: Progressing   

## 2023-10-31 DIAGNOSIS — F332 Major depressive disorder, recurrent severe without psychotic features: Secondary | ICD-10-CM | POA: Diagnosis not present

## 2023-10-31 MED ORDER — SERTRALINE HCL 100 MG PO TABS
100.0000 mg | ORAL_TABLET | Freq: Every day | ORAL | Status: DC
  Administered 2023-10-31: 100 mg via ORAL
  Filled 2023-10-31: qty 1
  Filled 2023-10-31: qty 7
  Filled 2023-10-31: qty 1

## 2023-10-31 NOTE — Group Note (Signed)
 Date:  10/31/2023 Time:  9:18 PM  Group Topic/Focus:  Wrap-Up Group:   The focus of this group is to help patients review their daily goal of treatment and discuss progress on daily workbooks.    Participation Level:  Active  Participation Quality:  Appropriate and Attentive  Affect:  Appropriate  Cognitive:  Alert and Appropriate  Insight: Appropriate and Good  Engagement in Group:  Engaged  Modes of Intervention:  Discussion and Education  Additional Comments:  Pt attended and participated in wrap up group this evening and rated their day an 8/10. Pt stated that they spoke to family and revealed that their Dad is a stressor. Pt goal was to eliminate stressors (Dad, environment) and to discuss relocating with their sister. Pt is anticipating being D/C Friday.   Sheila Camacho 10/31/2023, 9:18 PM

## 2023-10-31 NOTE — Group Note (Unsigned)
 Date:  10/31/2023 Time:  9:46 AM  Group Topic/Focus:  Goals Group:   The focus of this group is to help patients establish daily goals to achieve during treatment and discuss how the patient can incorporate goal setting into their daily lives to aide in recovery. Orientation:   The focus of this group is to educate the patient on the purpose and policies of crisis stabilization and provide a format to answer questions about their admission.  The group details unit policies and expectations of patients while admitted.     Participation Level:  {BHH PARTICIPATION WUJWJ:19147}  Participation Quality:  {BHH PARTICIPATION QUALITY:22265}  Affect:  {BHH AFFECT:22266}  Cognitive:  {BHH COGNITIVE:22267}  Insight: {BHH Insight2:20797}  Engagement in Group:  {BHH ENGAGEMENT IN WGNFA:21308}  Modes of Intervention:  {BHH MODES OF INTERVENTION:22269}  Additional Comments:  ***  Sheila Camacho 10/31/2023, 9:46 AM

## 2023-10-31 NOTE — Progress Notes (Signed)
   10/31/23 0800  Psych Admission Type (Psych Patients Only)  Admission Status Involuntary  Psychosocial Assessment  Patient Complaints None  Eye Contact Fair  Facial Expression Anxious  Affect Anxious  Speech Logical/coherent  Interaction Assertive  Motor Activity Fidgety  Appearance/Hygiene Unremarkable  Behavior Characteristics Cooperative  Mood Anxious  Thought Process  Coherency WDL  Content WDL  Delusions None reported or observed  Perception WDL  Hallucination None reported or observed  Judgment Impaired  Confusion None  Danger to Self  Current suicidal ideation? Denies  Agreement Not to Harm Self Yes  Description of Agreement verbal  Danger to Others  Danger to Others None reported or observed

## 2023-10-31 NOTE — Group Note (Signed)
 LCSW Group Therapy Note   Group Date: 10/31/2023 Start Time: 1100 End Time: 1200  Participation:  patient was present    Type of Therapy:  Group Therapy  Topic:  Stronger Together:  Building Healthy Relationships   Objective:  To explore loneliness, boundaries, and safe ways to build relationships.  Goals: Recognize healthy vs. unhealthy relationships. Learn safe ways to connect with others. Strengthen communication and [employer].  Summary:  Participants discussed loneliness, healthy connections, and setting boundaries. They explored safe ways to meet people and shared personal experiences. Key insights were reinforced through discussion and quotes.  Therapeutic Modalities Used: Cognitive Behavioral Therapy (CBT) Elements - Identifying unhealthy relationship patterns, challenging negative thoughts about connection. Dialectical Behavior Therapy (DBT) Elements - Interpersonal effectiveness, setting and maintaining boundaries. Supportive Group Therapy - Peer discussion, shared experiences, and emotional validation.   Sheila Camacho O Sheila Camacho, LCSWA 10/31/2023  6:04 PM

## 2023-10-31 NOTE — Plan of Care (Signed)
   Problem: Education: Goal: Emotional status will improve Outcome: Progressing Goal: Mental status will improve Outcome: Progressing   Problem: Activity: Goal: Interest or engagement in activities will improve Outcome: Progressing Goal: Sleeping patterns will improve Outcome: Progressing

## 2023-10-31 NOTE — Plan of Care (Signed)
  Problem: Education: Goal: Emotional status will improve Outcome: Progressing Goal: Verbalization of understanding the information provided will improve Outcome: Progressing   Problem: Activity: Goal: Sleeping patterns will improve Outcome: Progressing   Problem: Coping: Goal: Ability to demonstrate self-control will improve Outcome: Progressing   Problem: Health Behavior/Discharge Planning: Goal: Identification of resources available to assist in meeting health care needs will improve Outcome: Progressing   Problem: Physical Regulation: Goal: Ability to maintain clinical measurements within normal limits will improve Outcome: Progressing

## 2023-10-31 NOTE — Progress Notes (Signed)
   10/31/23 2330  Psych Admission Type (Psych Patients Only)  Admission Status Involuntary  Psychosocial Assessment  Patient Complaints None  Eye Contact Fair  Facial Expression Worried  Affect Anxious  Speech Logical/coherent  Interaction Assertive  Motor Activity Fidgety  Appearance/Hygiene Disheveled  Behavior Characteristics Cooperative  Mood Anxious  Aggressive Behavior  Effect No apparent injury  Thought Process  Coherency WDL  Content WDL  Delusions WDL  Perception WDL  Hallucination None reported or observed  Judgment WDL  Confusion WDL  Danger to Self  Current suicidal ideation? Denies  Danger to Others  Danger to Others None reported or observed

## 2023-10-31 NOTE — Progress Notes (Signed)
 [facility] MD Progress Note  10/31/2023 6:28 PM Sheila Camacho  MRN:  [id number] Principal Problem: MDD (major depressive disorder), recurrent severe, without psychosis (HCC) Diagnosis: Principal Problem:   MDD (major depressive disorder), recurrent severe, without psychosis (HCC) Active Problems:   GAD (generalized anxiety disorder)   DiGeorge's syndrome (HCC)  HPI: Sheila Camacho is a 28 y.o. Caucasian Sheila Camacho with prior mental health diagnoses of MDD & ADHD & a medical history of QT prolongation & DiGeorge syndrome who presented initially to the [place] [facility] [place] [facility] 4/10 accompanied by her sister after an overdose on Benadryl with an intent to end her life.  As per ER documentation, patient took "8 50 mg Raxell Benadryl sleep aides". Patient was treated and medically cleared prior to being transferred to this [facility] under IVC status for treatment & stabilization of her mental status.   24hr Chart Review: Patient slept 6.5 hours as per nursing flowsheets. Patient is remaining medication compliant, & taking scheduled Zoloft and is continuing to tolerate it with no concerns.. Only PRN medication requested by patient in the past 24hrs. Vitals signs in the last 24hrs WNL. Labs in the last 24 hours has been Hydroxyzine.  Repeating UA since last one was positive for small Hgb and leukocytes. Mucus present on UA microscopy as well, and patient endorsed UTI symptoms yesterday and was started on Macrobid BID x 5 days.  Today's Patient Assessment: On assessment today, the pt reports that their mood is euthymic, improved since admission, and stable. Denies feeling down, depressed, or sad.  Reports that anxiety symptoms are at manageable level.  Sleep is stable. Appetite is stable.  Concentration is without complaint.  Energy level is adequate. Denies having any suicidal thoughts. Denies having any suicidal intent and plan.  Denies having any HI.  Denies having psychotic symptoms.    Denies having side effects to current psychiatric medications.  Discussed discharge planning for tomorrow, 4/16 at home with her father.  Writer will call father tomorrow morning to educate on medication safety and asked father if he can keep medications in his possession, with patient having no access and admits that he medications the patient as scheduled.  Patient also asking for an appointment with her therapist at [facility], will relay information to the social work.  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 11/2016 Echo: EF 55-60%, Mild MR w/ mild prolapse; b. 07/2019 Echo: EF 50-55%, no rwma. 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   Palpitations    Prolonged QT interval    PTSD (post-traumatic stress disorder)     Past Surgical History:  Procedure Laterality Date   HERNIA REPAIR  2005   Family History:  Family History  Problem Relation Age of Onset   Mitral valve prolapse Mother    Asthma Maternal Grandmother    Cancer Neg Hx    Family Psychiatric  History: See H&P Social History:  Social History   Substance and Sexual Activity  Alcohol Use No   Alcohol/week: 0.0 standard drinks of alcohol     Social History   Substance and Sexual Activity  Drug Use No  Social History   Socioeconomic History   Marital status: Single    Spouse name: Not on file   Number of children: Not on file   Years of education: Not on file   Highest education level: Not on file  Occupational History   Not on file  Tobacco Use   Smoking status: Former   Smokeless tobacco: Never  Vaping Use   Vaping status: Never Used  Substance and Sexual Activity   Alcohol use: No    Alcohol/week: 0.0  standard drinks of alcohol   Drug use: No   Sexual activity: Never  Other Topics Concern   Not on file  Social History Narrative   Lives with Mom and boyfriend.   Sees Dad every other weekend and twice a week.   Immunizations not currently up to date.   Cigarette smoke outside of house.   [employer]   Some learning issues at school, has tutors   Social Drivers of Corporate investment banker Strain: Not on file  Food Insecurity: No Food Insecurity (10/27/2023)   Hunger Vital Sign    Worried About Running Out of Food in the Last Year: Never true    Ran Out of Food in the Last Year: Never true  Transportation Needs: No Transportation Needs (10/27/2023)   PRAPARE - Administrator, Civil Service (Medical): No    Lack of Transportation (Non-Medical): No  Physical Activity: Not on file  Stress: Not on file  Social Connections: Not on file   Sleep: Good  Appetite:  Good  Current Medications: Current Facility-Administered Medications  Medication Dose Route Frequency Provider Last Rate Last Admin   acetaminophen (TYLENOL) tablet 650 mg  650 mg Oral Q6H PRN Penn, Cicely, NP       alum & mag hydroxide-simeth (MAALOX/MYLANTA) 200-200-20 MG/5ML suspension 30 mL  30 mL Oral Q4H PRN Penn, Cicely, NP       cyanocobalamin (VITAMIN B12) tablet 1,000 mcg  1,000 mcg Oral Daily Starleen Blue, NP   1,000 mcg at 10/31/23 0746   haloperidol (HALDOL) tablet 5 mg  5 mg Oral TID PRN Mcneil Sober, NP       And   diphenhydrAMINE (BENADRYL) capsule 50 mg  50 mg Oral TID PRN Mcneil Sober, NP       haloperidol lactate (HALDOL) injection 10 mg  10 mg Intramuscular TID PRN Penn, Cranston Neighbor, NP       And   diphenhydrAMINE (BENADRYL) injection 50 mg  50 mg Intramuscular TID PRN Penn, Cranston Neighbor, NP       And   LORazepam (ATIVAN) injection 2 mg  2 mg Intramuscular TID PRN Penn, Cranston Neighbor, NP       haloperidol lactate (HALDOL) injection 5 mg  5 mg Intramuscular TID PRN Penn, Cranston Neighbor, NP       And    diphenhydrAMINE (BENADRYL) injection 50 mg  50 mg Intramuscular TID PRN Penn, Cranston Neighbor, NP       And   LORazepam (ATIVAN) injection 2 mg  2 mg Intramuscular TID PRN Mcneil Sober, NP       hydrOXYzine (ATARAX) tablet 25 mg  25 mg Oral TID PRN Starleen Blue, NP   25 mg at 10/28/23 1320   magnesium hydroxide (MILK OF MAGNESIA) suspension 30 mL  30 mL Oral Daily PRN Penn, Cranston Neighbor, NP       melatonin tablet 3 mg  3 mg Oral QHS Miguel Rota, MD   3 mg at 10/30/23 2106  nitrofurantoin (macrocrystal-monohydrate) (MACROBID) capsule 100 mg  100 mg Oral Q12H Houda Brau, NP   100 mg at 10/31/23 0746   sertraline (ZOLOFT) tablet 100 mg  100 mg Oral QHS Raylie Maddison, NP       Vitamin D (Ergocalciferol) (DRISDOL) 1.25 MG (50000 UNIT) capsule 50,000 Units  50,000 Units Oral Q7 days Robet Chiquito, NP   50,000 Units at 10/29/23 1627    Lab Results:  No results found for this or any previous visit (from the past 48 hours).   Blood Alcohol level:  Lab Results  Component Value Date   ETH <10 10/26/2023    Metabolic Disorder Labs: Lab Results  Component Value Date   HGBA1C 5.3 10/29/2023   MPG 105 10/29/2023   No results found for: "PROLACTIN" Lab Results  Component Value Date   CHOL 203 (H) 10/29/2023   TRIG 83 10/29/2023   HDL 52 10/29/2023   CHOLHDL 3.9 10/29/2023   VLDL 17 10/29/2023   LDLCALC 134 (H) 10/29/2023   LDLCALC (H) 09/10/2010    112        Total Cholesterol/HDL:CHD Risk Coronary Heart Disease Risk Table                     Men   Women  1/2 Average Risk   3.4   3.3  Average Risk       5.0   4.4  2 X Average Risk   9.6   7.1  3 X Average Risk  23.4   11.0        Use the calculated Patient Ratio above and the CHD Risk Table to determine the patient's CHD Risk.        ATP III CLASSIFICATION (LDL):  <100     mg/dL   Optimal  841-324  mg/dL   Near or Above                    Optimal  130-159  mg/dL   Borderline  401-027  mg/dL   High  >253     mg/dL   Very High     Musculoskeletal: Strength & Muscle Tone: within normal limits Gait & Station: normal Patient leans: N/A  Psychiatric Specialty Exam:  Presentation  General Appearance:  Appropriate for Environment; Fairly Groomed  Eye Contact: Fair  Speech: Clear and Coherent  Speech Volume: Normal  Handedness: Right   Mood and Affect  Mood: Euthymic  Affect: Congruent   Thought Process  Thought Processes: Coherent  Descriptions of Associations:Intact  Orientation:Full (Time, Place and Person)  Thought Content:Logical  History of Schizophrenia/Schizoaffective disorder:No data recorded Duration of Psychotic Symptoms:No data recorded Hallucinations:Hallucinations: Visual; None  Ideas of Reference:None  Suicidal Thoughts:Suicidal Thoughts: No  Homicidal Thoughts:Homicidal Thoughts: No   Sensorium  Memory: Immediate Fair  Judgment: Fair  Insight: Fair   Chartered certified accountant: Fair  Attention Span: Fair  Recall: Fair  Fund of Knowledge: Fair  Language: Fair   Psychomotor Activity  Psychomotor Activity: Psychomotor Activity: Normal   Assets  Assets: Resilience   Sleep  Sleep: Sleep: Good    Physical Exam: Physical Exam Vitals and nursing note reviewed.  Eyes:     Pupils: Pupils are equal, round, and reactive to light.  Neurological:     General: No focal deficit present.     Mental Status: She is oriented to person, place, and time.    Review of Systems  Psychiatric/Behavioral:  Positive for depression. Negative for  hallucinations, memory loss, substance abuse and suicidal ideas. The patient is nervous/anxious and has insomnia.   All other systems reviewed and are negative.  Blood pressure 96/70, pulse 96, temperature 98.3 F (36.8 C), temperature source Oral, resp. rate 16, height 5\' 6"  (1.676 m), weight 46.7 kg, last menstrual period 10/26/2023, SpO2 99%. Body mass index is 16.62 kg/m.  Treatment Plan  Summary: Daily contact with patient to assess and evaluate symptoms and progress in treatment and Medication management   Safety and Monitoring: Voluntary admission to inpatient psychiatric unit for safety, stabilization and treatment Daily contact with patient to assess and evaluate symptoms and progress in treatment Patient's case to be discussed in multi-disciplinary team meeting Observation Level : q15 minute checks Vital signs: q12 hours Precautions: Safety   Long Term Goal(s): Improvement in symptoms so as ready for discharge   Short Term Goals: Ability to identify changes in lifestyle to reduce recurrence of condition will improve, Ability to verbalize feelings will improve, Ability to disclose and discuss suicidal ideas, Ability to demonstrate self-control will improve, Ability to identify and develop effective coping behaviors will improve, Ability to maintain clinical measurements within normal limits will improve, Compliance with prescribed medications will improve, and Ability to identify triggers associated with substance abuse/mental health issues will improve   Diagnoses Principal Problem:   MDD (major depressive disorder), recurrent severe, without psychosis (HCC) Active Problems:   GAD (generalized anxiety disorder)   DiGeorge's syndrome (HCC)   Medications -Increase Zoloft from 50 to 100 mg nightly for MDD & GAD starting 4/15. -Start macrobid 100 mg BID for UTI symptoms x 5 days -Continue Melatonin 3 mg nightly for sleep -Continue nicotine patch 14 mg Q 24 Hrs for nicotine use disorder. -Continue Vitamin B12 1000 mg daily for supplementation -Continue Vitamin D 50.000 units weekly for supplementation    PRNS -Continue hydroxyzine 25 mg 3 times daily as needed for anxiety -Continue Agitation Protocol as per the MAR-See MAR  -Continue Tylenol 650 mg every 6 hours PRN for mild pain -Continue Maalox 30 mg every 4 hrs PRN for indigestion -Continue Milk of Magnesia as  needed every 6 hrs for constipation   Labs reviewed, no new orders placed.    Discharge Planning: Social work and case management to assist with discharge planning and identification of hospital follow-up needs prior to discharge Estimated LOS: 5-7 days Discharge Concerns: Need to establish a safety plan; Medication compliance and effectiveness Discharge Goals: Return home with outpatient referrals for mental health follow-up including medication management/psychotherapy  Robet Chiquito, NP 10/31/2023, 6:28 PM Patient ID: Sheila Camacho, Sheila Camacho   DOB: 1996/04/18, 28 y.o.   MRN: [id number]

## 2023-11-01 DIAGNOSIS — F332 Major depressive disorder, recurrent severe without psychotic features: Secondary | ICD-10-CM | POA: Diagnosis not present

## 2023-11-01 MED ORDER — VITAMIN D (ERGOCALCIFEROL) 1.25 MG (50000 UNIT) PO CAPS: 50000.0000 [IU] | ORAL_CAPSULE | ORAL | 0 refills | Status: AC

## 2023-11-01 MED ORDER — NITROFURANTOIN MONOHYD MACRO 100 MG PO CAPS: 100.0000 mg | ORAL_CAPSULE | Freq: Two times a day (BID) | ORAL | 0 refills | Status: AC

## 2023-11-01 MED ORDER — CYANOCOBALAMIN 1000 MCG PO TABS: 1000.0000 ug | ORAL_TABLET | Freq: Every day | ORAL | 0 refills | Status: AC

## 2023-11-01 MED ORDER — MELATONIN 3 MG PO TABS: 3.0000 mg | ORAL_TABLET | Freq: Every day | ORAL | 0 refills | Status: AC

## 2023-11-01 MED ORDER — SERTRALINE HCL 100 MG PO TABS: 100.0000 mg | ORAL_TABLET | Freq: Every day | ORAL | 0 refills | Status: AC

## 2023-11-01 NOTE — Progress Notes (Signed)
  [facility] Adult Case Management Discharge Plan :  Will you be returning to the same living situation after discharge:  Yes,  pt is returning home at discharge At discharge, do you have transportation home?: Yes,  pt will be picked up around 1230PM Do you have the ability to pay for your medications: No. Pt does not have insurance, samples requested at discharge  Release of information consent forms completed and in the chart;  Patient's signature needed at discharge.  Patient to Follow up at:  Follow-up Information     [facility]. Go on 11/06/2023.   Why: You have a hospital follow up appointment on 11/06/23 at 10:00 am .  The appointment will be held in person.  Following this appointment, you will be scheduled for a clinical assessment to obtain therapy and medication management services. Contact information: [address] [place] [address] [phone]                 Next level of care provider has access to [facility] Link:no  Safety Planning and Suicide Prevention discussed: Yes,  [name] [name] [phone] (father) and with patient     Has patient been referred to the Quitline?: Patient refused referral for treatment  Patient has been referred for addiction treatment: No known substance use disorder.  Vonzell Guerin, LCSWA 11/01/2023, 9:46 AM

## 2023-11-01 NOTE — [facility] Suicide Risk Assessment (Signed)
 Suicide Risk Assessment  Discharge Assessment    [facility] Discharge Suicide Risk Assessment   Principal Problem: MDD (major depressive disorder), recurrent severe, without psychosis (HCC) Discharge Diagnoses: Principal Problem:   MDD (major depressive disorder), recurrent severe, without psychosis (HCC) Active Problems:   GAD (generalized anxiety disorder)   DiGeorge's syndrome (HCC)  HPI: Sheila Camacho is a 28 y.o. Caucasian female with prior mental health diagnoses of MDD & ADHD & a medical history of QT prolongation & DiGeorge syndrome who presented initially to the  @ [place] ER 4/10 accompanied by her sister after an overdose on Benadryl with an intent to end her life.  As per ER documentation, patient took "8 50 mg Raxell Benadryl sleep aides". Patient was treated and medically cleared prior to being transferred to this [facility] under IVC status for treatment & stabilization of her mental status.   Hospital Course: During the patient's hospitalization, patient had extensive initial psychiatric evaluation, and follow-up psychiatric evaluations every day. Psychiatric diagnoses provided upon initial assessment: As listed above.  Patient's psychiatric medications were adjusted on admission: As follows: -Start Zoloft 25 mg x1 dose today, increase to 50 mg on 4/13 for MDD & GAD -Start Melatonin 3 mg nightly for sleep -Start nicotine patch 14 mg every 24 hours for nicotine use disorder. During the hospitalization, other adjustments were made to the patient's psychiatric medication regimen: Medications at discharge are as follows: -Continue Zoloft 100 mg nightly for MDD & GAD  -Continue macrobid 100 mg BID for UTI symptoms x 4 more days & then stop & f/u with your primary care provider. -Continue Melatonin 3 mg nightly for sleep -Continue Vitamin B12 1000 mg daily for supplementation -Continue Vitamin D 50.000 units weekly for supplementation Writer spoke with pt's  father-Sheila Camacho (Father) [phone] (Home Phone), & he states that he will keep medications away from patient, and will administer them to patient as scheduled. Writer educated father on date and time of the f/u appt.   Patient's care was discussed during the interdisciplinary team meeting every day during the hospitalization. The patient denies having side effects to prescribed psychiatric medication. Gradually, patient started adjusting to milieu. The patient was evaluated each day by a clinical provider to ascertain response to treatment. Improvement was noted by the patient's report of decreasing symptoms, improved sleep and appetite, affect, medication tolerance, behavior, and participation in unit programming.  Patient was asked each day to complete a self inventory noting mood, mental status, pain, new symptoms, anxiety and concerns. Symptoms were reported as significantly decreased or resolved completely by discharge.   On day of discharge, the patient reports that their mood is stable. The patient denied having suicidal thoughts for more than 48 hours prior to discharge.  Patient denies having homicidal thoughts.  Patient denies having auditory hallucinations.  Patient denies any visual hallucinations or other symptoms of psychosis. The patient was motivated to continue taking medication with a goal of continued improvement in mental health.   The patient reports their target psychiatric symptoms of depression, anxiety responded well to the psychiatric medications, and the patient reports overall benefit other psychiatric hospitalization. Supportive psychotherapy was provided to the patient. The patient also participated in regular group therapy while hospitalized. Coping skills, problem solving as well as relaxation therapies were also part of the unit programming.  Labs were reviewed with the patient, and abnormal results were discussed with the patient.  The patient is able to verbalize  their individual safety plan to this provider.  #  It is recommended to the patient to continue psychiatric medications as prescribed, after discharge from the hospital.    # It is recommended to the patient to follow up with your outpatient psychiatric provider and PCP.  # It was discussed with the patient, the impact of alcohol, drugs, tobacco have been there overall psychiatric and medical wellbeing, and total abstinence from substance use was recommended the patient.ed.  # Prescriptions provided or sent directly to preferred pharmacy at discharge. Patient agreeable to plan. Given opportunity to ask questions. Appears to feel comfortable with discharge.    # In the event of worsening symptoms, the patient is instructed to call the crisis hotline, (988) 911 and or go to the nearest ED for appropriate evaluation and treatment of symptoms. To follow-up with primary care provider for other medical issues, concerns and or health care needs  # Patient was discharged with a plan to follow up as noted below.   Total Time spent with patient: 45 minutes  Musculoskeletal: Strength & Muscle Tone: within normal limits Gait & Station: normal Patient leans: N/A  Psychiatric Specialty Exam  Presentation  General Appearance:  Appropriate for Environment; Fairly Groomed  Eye Contact: Fair  Speech: Clear and Coherent  Speech Volume: Normal  Handedness: Right   Mood and Affect  Mood: Euthymic  Duration of Depression Symptoms: No data recorded Affect: Congruent   Thought Process  Thought Processes: Coherent  Descriptions of Associations:Intact  Orientation:Full (Time, Place and Person)  Thought Content:Logical  History of Schizophrenia/Schizoaffective disorder:No data recorded Duration of Psychotic Symptoms:No data recorded Hallucinations:Hallucinations: None  Ideas of Reference:None  Suicidal Thoughts:Suicidal Thoughts: No  Homicidal Thoughts:Homicidal Thoughts:  No   Sensorium  Memory: Immediate Good  Judgment: Good  Insight: Good   Executive Functions  Concentration: Good  Attention Span: Good  Recall: Good  Fund of Knowledge: Good  Language: Good   Psychomotor Activity  Psychomotor Activity: Psychomotor Activity: Normal   Assets  Assets: Resilience   Sleep  Sleep: Sleep: Good   Physical Exam: Physical Exam Vitals and nursing note reviewed.  Constitutional:      Appearance: Normal appearance.  HENT:     Head: Normocephalic.     Nose: Nose normal.  Pulmonary:     Effort: Pulmonary effort is normal.  Musculoskeletal:     Cervical back: Normal range of motion.  Neurological:     General: No focal deficit present.     Mental Status: She is alert and oriented to person, place, and time.  Psychiatric:        Mood and Affect: Mood normal.        Behavior: Behavior normal.        Thought Content: Thought content normal.        Judgment: Judgment normal.    Review of Systems  Psychiatric/Behavioral:  Positive for depression.    Blood pressure 99/73, pulse 94, temperature 98.2 F (36.8 C), temperature source Oral, resp. rate 16, height 5\' 6"  (1.676 m), weight 46.7 kg, last menstrual period 10/26/2023, SpO2 99%. Body mass index is 16.62 kg/m.  Mental Status Per Nursing Assessment::   On Admission:  NA  Demographic Factors:  Caucasian, Low socioeconomic status, and Unemployed  Loss Factors: Financial problems/change in socioeconomic status  Historical Factors: Impulsivity  Risk Reduction Factors:   Living with another person, especially a relative and Positive social support  Continued Clinical Symptoms:  Previous Psychiatric Diagnoses and Treatments  Cognitive Features That Contribute To Risk:  None  Suicide Risk:  Minimal: No identifiable suicidal ideation.  Patients presenting with no risk factors but with morbid ruminations; may be classified as minimal risk based on the severity of  the depressive symptoms    Follow-up Information     [facility]. Go on 11/06/2023.   Why: You have a hospital follow up appointment on 11/06/23 at 10:00 am .  The appointment will be held in person.  Following this appointment, you will be scheduled for a clinical assessment to obtain therapy and medication management services. Contact information: [address] [place] [address] [phone]                Robet Chiquito, NP 11/01/2023, 10:27 AM

## 2023-11-01 NOTE — Discharge Summary (Signed)
 Physician Discharge Summary Note  Patient:  Sheila Camacho is an 28 y.o., female MRN:  [id number] DOB:  09-16-1995 Patient phone:  [phone] (home)  Patient address:   [address] [place] [address],  Total Time spent with patient: 45 minutes  Date of Admission:  10/27/2023 Date of Discharge: 11/01/2023  Reason for Admission: HPI: Sheila Camacho is a 28 y.o. Caucasian female with prior mental health diagnoses of MDD & ADHD & a medical history of QT prolongation & DiGeorge syndrome who presented initially to the [facility] 4/10 accompanied by her sister after an overdose on Benadryl with an intent to end her life.  As per ER documentation, patient took "8 50 mg Raxell Benadryl sleep aides". Patient was treated and medically cleared prior to being transferred to this [facility] under IVC status for treatment & stabilization of her mental status.    Hospital Course: During the patient's hospitalization, patient had extensive initial psychiatric evaluation, and follow-up psychiatric evaluations every day. Psychiatric diagnoses provided upon initial assessment: As listed above.  Patient's psychiatric medications were adjusted on admission: As follows: -Start Zoloft 25 mg x1 dose today, increase to 50 mg on 4/13 for MDD & GAD -Start Melatonin 3 mg nightly for sleep -Start nicotine patch 14 mg every 24 hours for nicotine use disorder. During the hospitalization, other adjustments were made to the patient's psychiatric medication regimen: Medications at discharge are as follows: -Continue Zoloft 100 mg nightly for MDD & GAD  -Continue macrobid 100 mg BID for UTI symptoms x 4 more days & then stop & f/u with your primary care provider. -Continue Melatonin 3 mg nightly for sleep -Continue Vitamin B12 1000 mg daily for supplementation -Continue Vitamin D 50.000 units weekly for supplementation Writer spoke with pt's father-Sheila,Camacho  (Father) [phone] (Home Phone), & he states that he will keep medications away from patient, and will administer them to patient as scheduled. Writer educated father on date and time of the f/u appt.    Patient's care was discussed during the interdisciplinary team meeting every day during the hospitalization. The patient denies having side effects to prescribed psychiatric medication. Gradually, patient started adjusting to milieu. The patient was evaluated each day by a clinical provider to ascertain response to treatment. Improvement was noted by the patient's report of decreasing symptoms, improved sleep and appetite, affect, medication tolerance, behavior, and participation in unit programming.  Patient was asked each day to complete a self inventory noting mood, mental status, pain, new symptoms, anxiety and concerns. Symptoms were reported as significantly decreased or resolved completely by discharge.    On day of discharge, the patient reports that their mood is stable. The patient denied having suicidal thoughts for more than 48 hours prior to discharge.  Patient denies having homicidal thoughts.  Patient denies having auditory hallucinations.  Patient denies any visual hallucinations or other symptoms of psychosis. The patient was motivated to continue taking medication with a goal of continued improvement in mental health.    The patient reports their target psychiatric symptoms of depression, anxiety responded well to the psychiatric medications, and the patient reports overall benefit other psychiatric hospitalization. Supportive psychotherapy was provided to the patient. The patient also participated in regular group therapy while hospitalized. Coping skills, problem solving as well as relaxation therapies were also part of the unit programming.   Labs were reviewed with the patient, and abnormal results were discussed with the patient.   The patient is  able to verbalize their  individual safety plan to this provider.   # It is recommended to the patient to continue psychiatric medications as prescribed, after discharge from the hospital.     # It is recommended to the patient to follow up with your outpatient psychiatric provider and PCP.   # It was discussed with the patient, the impact of alcohol, drugs, tobacco have been there overall psychiatric and medical wellbeing, and total abstinence from substance use was recommended the patient.ed.   # Prescriptions provided or sent directly to preferred pharmacy at discharge. Patient agreeable to plan. Given opportunity to ask questions. Appears to feel comfortable with discharge.    # In the event of worsening symptoms, the patient is instructed to call the crisis hotline, (988) 911 and or go to the nearest ED for appropriate evaluation and treatment of symptoms. To follow-up with primary care provider for other medical issues, concerns and or health care needs   # Patient was discharged with a plan to follow up as noted below.    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 11/2016 Echo: EF 55-60%, Mild MR w/ mild prolapse; b. 07/2019 Echo: EF 50-55%, no rwma. 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   Palpitations    Prolonged QT interval    PTSD (post-traumatic stress disorder)     Past Surgical History:  Procedure  Laterality Date   HERNIA REPAIR  2005   Family History:  Family History  Problem Relation Age of Onset   Mitral valve prolapse Mother    Asthma Maternal Grandmother    Cancer Neg Hx    Family Psychiatric  History: See H & P Social History:  Social History   Substance and Sexual Activity  Alcohol Use No   Alcohol/week: 0.0 standard drinks of alcohol     Social History   Substance and Sexual Activity  Drug Use No    Social History   Socioeconomic History   Marital status: Single    Spouse name: Not on file   Number of children: Not on file   Years of education: Not on file   Highest education level: Not on file  Occupational History   Not on file  Tobacco Use   Smoking status: Former   Smokeless tobacco: Never  Vaping Use   Vaping status: Never Used  Substance and Sexual Activity   Alcohol use: No    Alcohol/week: 0.0 standard drinks of alcohol   Drug use: No   Sexual activity: Never  Other Topics Concern   Not on file  Social History Narrative   Lives with Mom and boyfriend.   Sees Dad every other weekend and twice a week.   Immunizations not currently up to date.   Cigarette smoke outside of house.   [employer]   Some learning issues at school, has tutors   Social  Drivers of Corporate investment banker Strain: Not on file  Food Insecurity: No Food Insecurity (10/27/2023)   Hunger Vital Sign    Worried About Running Out of Food in the Last Year: Never true    Ran Out of Food in the Last Year: Never true  Transportation Needs: No Transportation Needs (10/27/2023)   PRAPARE - Administrator, Civil Service (Medical): No    Lack of Transportation (Non-Medical): No  Physical Activity: Not on file  Stress: Not on file  Social Connections: Not on file   Physical Findings: AIMS:0 CIWA:  n/a COWS:   n/a  Musculoskeletal: Strength & Muscle Tone: within normal limits Gait & Station: normal Patient leans: N/A   Psychiatric  Specialty Exam:  Presentation  General Appearance:  Appropriate for Environment; Fairly Groomed  Eye Contact: Fair  Speech: Clear and Coherent  Speech Volume: Normal  Handedness: Right   Mood and Affect  Mood: Euthymic  Affect: Congruent   Thought Process  Thought Processes: Coherent  Descriptions of Associations:Intact  Orientation:Full (Time, Place and Person)  Thought Content:Logical  History of Schizophrenia/Schizoaffective disorder:No data recorded Duration of Psychotic Symptoms:No data recorded Hallucinations:Hallucinations: None  Ideas of Reference:None  Suicidal Thoughts:Suicidal Thoughts: No  Homicidal Thoughts:Homicidal Thoughts: No   Sensorium  Memory: Immediate Good  Judgment: Good  Insight: Good   Executive Functions  Concentration: Good  Attention Span: Good  Recall: Good  Fund of Knowledge: Good  Language: Good   Psychomotor Activity  Psychomotor Activity: Psychomotor Activity: Normal   Assets  Assets: Resilience   Sleep  Sleep: Sleep: Good    Physical Exam: Physical Exam Vitals and nursing note reviewed.  Neurological:     General: No focal deficit present.     Mental Status: She is oriented to person, place, and time.  Psychiatric:        Mood and Affect: Mood normal.        Behavior: Behavior normal.        Thought Content: Thought content normal.        Judgment: Judgment normal.    Review of Systems  Psychiatric/Behavioral:  Positive for depression (Denies SI/HI, denies intent and denies plan to harm self or others). Negative for hallucinations, memory loss, substance abuse and suicidal ideas. The patient is nervous/anxious (stable for outpatient management) and has insomnia (stable).    Blood pressure 99/73, pulse 94, temperature 98.2 F (36.8 C), temperature source Oral, resp. rate 16, height 5\' 6"  (1.676 m), weight 46.7 kg, last menstrual period 10/26/2023, SpO2 99%. Body mass index is  16.62 kg/m.   Social History   Tobacco Use  Smoking Status Former  Smokeless Tobacco Never   Tobacco Cessation:  N/A, patient does not currently use tobacco products   Blood Alcohol level:  Lab Results  Component Value Date   ETH <10 10/26/2023    Metabolic Disorder Labs:  Lab Results  Component Value Date   HGBA1C 5.3 10/29/2023   MPG 105 10/29/2023   No results found for: "PROLACTIN" Lab Results  Component Value Date   CHOL 203 (H) 10/29/2023   TRIG 83 10/29/2023   HDL 52 10/29/2023   CHOLHDL 3.9 10/29/2023   VLDL 17 10/29/2023   LDLCALC 134 (H) 10/29/2023   LDLCALC (H) 09/10/2010    112        Total Cholesterol/HDL:CHD Risk Coronary Heart Disease Risk Table  Men   Women  1/2 Average Risk   3.4   3.3  Average Risk       5.0   4.4  2 X Average Risk   9.6   7.1  3 X Average Risk  23.4   11.0        Use the calculated Patient Ratio above and the CHD Risk Table to determine the patient's CHD Risk.        ATP III CLASSIFICATION (LDL):  <100     mg/dL   Optimal  130-865  mg/dL   Near or Above                    Optimal  130-159  mg/dL   Borderline  784-696  mg/dL   High  >295     mg/dL   Very High    See Psychiatric Specialty Exam and Suicide Risk Assessment completed by Attending Physician prior to discharge.  Discharge destination:  Home  Is patient on multiple antipsychotic therapies at discharge:  No   Has Patient had three or more failed trials of antipsychotic monotherapy by history:  No  Recommended Plan for Multiple Antipsychotic Therapies: NA  Discharge Instructions     [facility] pharmacy consult for medication samples   Complete by: As directed    Pharmacist, please provide 7 days worth of medication samples. Thanks      Allergies as of 11/01/2023       Reactions   Garcinia Mangostana Extract [garcinia Cambogia] Swelling   Pt allergic to the fruit mango        Medication List     STOP taking these medications     diphenhydrAMINE 50 MG capsule Commonly known as: BENADRYL       TAKE these medications      Indication  cyanocobalamin 1000 MCG tablet Take 1 tablet (1,000 mcg total) by mouth daily.  Indication: Inadequate Vitamin B12   melatonin 3 MG Tabs tablet Take 1 tablet (3 mg total) by mouth at bedtime.  Indication: Trouble Sleeping   nitrofurantoin (macrocrystal-monohydrate) 100 MG capsule Commonly known as: MACROBID Take 1 capsule (100 mg total) by mouth every 12 (twelve) hours.  Indication: Simple Infection of the Urinary Tract   sertraline 100 MG tablet Commonly known as: ZOLOFT Take 1 tablet (100 mg total) by mouth at bedtime.  Indication: Major Depressive Disorder, Social Anxiety Disorder   Vitamin D (Ergocalciferol) 1.25 MG (50000 UNIT) Caps capsule Commonly known as: DRISDOL Take 1 capsule (50,000 Units total) by mouth every 7 (seven) days. Start taking on: November 05, 2023  Indication: Vitamin D Deficiency        Follow-up Information     [facility]. Go on 11/06/2023.   Why: You have a hospital follow up appointment on 11/06/23 at 10:00 am .  The appointment will be held in person.  Following this appointment, you will be scheduled for a clinical assessment to obtain therapy and medication management services. Contact information: [address] [place] [address] [phone]                Signed: Robet Chiquito, NP 11/01/2023, 4:25 PM

## 2023-11-01 NOTE — Progress Notes (Signed)
 Discharge Note:  Patient denies SI/HI/AVH at this time. Discharge instructions, AVS, prescriptions, and transition record gone over with patient. Patient agrees to adhere with medication management, follow-up visit, and outpatient therapy. Patient belongings returned to patient. Patient questions and concerns addressed and answered. Patient ambulatory off unit. Patient discharged to home with transportation by friend.

## 2023-11-01 NOTE — Progress Notes (Signed)
   11/01/23 0800  Psych Admission Type (Psych Patients Only)  Admission Status Involuntary  Psychosocial Assessment  Patient Complaints None  Eye Contact Fair  Facial Expression Anxious  Affect Anxious  Speech Logical/coherent  Interaction Assertive  Motor Activity Fidgety  Appearance/Hygiene Improved  Behavior Characteristics Cooperative  Mood Anxious;Pleasant  Thought Process  Coherency WDL  Content WDL  Delusions None reported or observed  Perception WDL  Hallucination None reported or observed  Judgment WDL  Confusion WDL  Danger to Self  Current suicidal ideation? Denies  Danger to Others  Danger to Others None reported or observed

## 2023-11-01 NOTE — Group Note (Signed)
 Date:  11/01/2023 Time:  9:43 AM  Group Topic/Focus:  Goals Group:   The focus of this group is to help patients establish daily goals to achieve during treatment and discuss how the patient can incorporate goal setting into their daily lives to aide in recovery.    Participation Level:  Active  Participation Quality:  Appropriate  Affect:  Appropriate  Sheila Camacho 11/01/2023, 9:43 AM

## 2024-03-01 ENCOUNTER — Ambulatory Visit: Payer: MEDICAID

## 2024-03-04 ENCOUNTER — Ambulatory Visit: Payer: MEDICAID

## 2024-05-20 ENCOUNTER — Ambulatory Visit: Payer: MEDICAID
